# Patient Record
Sex: Female | Born: 1960 | Race: White | Hispanic: No | Marital: Married | Smoking: Never smoker
Health system: Southern US, Community
[De-identification: ages and names within clinical notes are randomized; demographics above are authoritative.]

## PROBLEM LIST (undated history)

## (undated) DIAGNOSIS — I10 Essential (primary) hypertension: Secondary | ICD-10-CM

## (undated) DIAGNOSIS — G43909 Migraine, unspecified, not intractable, without status migrainosus: Secondary | ICD-10-CM

## (undated) DIAGNOSIS — J349 Unspecified disorder of nose and nasal sinuses: Secondary | ICD-10-CM

## (undated) DIAGNOSIS — R51 Headache: Secondary | ICD-10-CM

## (undated) DIAGNOSIS — M1990 Unspecified osteoarthritis, unspecified site: Secondary | ICD-10-CM

## (undated) DIAGNOSIS — E039 Hypothyroidism, unspecified: Secondary | ICD-10-CM

## (undated) DIAGNOSIS — J302 Other seasonal allergic rhinitis: Secondary | ICD-10-CM

## (undated) DIAGNOSIS — M069 Rheumatoid arthritis, unspecified: Secondary | ICD-10-CM

## (undated) DIAGNOSIS — N189 Chronic kidney disease, unspecified: Secondary | ICD-10-CM

## (undated) DIAGNOSIS — Z87442 Personal history of urinary calculi: Secondary | ICD-10-CM

## (undated) DIAGNOSIS — K219 Gastro-esophageal reflux disease without esophagitis: Secondary | ICD-10-CM

## (undated) DIAGNOSIS — R011 Cardiac murmur, unspecified: Secondary | ICD-10-CM

## (undated) DIAGNOSIS — E079 Disorder of thyroid, unspecified: Secondary | ICD-10-CM

## (undated) HISTORY — DX: Disorder of thyroid, unspecified: E07.9

## (undated) HISTORY — PX: COLONOSCOPY: SHX174

## (undated) HISTORY — PX: LITHOTRIPSY: SUR834

## (undated) HISTORY — PX: RHINOPLASTY: SUR1284

## (undated) HISTORY — PX: OTHER SURGICAL HISTORY: SHX169

## (undated) HISTORY — PX: NASAL TURBINATE REDUCTION: SHX2072

## (undated) HISTORY — PX: SINUS EXPLORATION: SHX5214

---

## 2000-07-30 ENCOUNTER — Encounter: Payer: Self-pay | Admitting: Orthopedic Surgery

## 2000-07-30 ENCOUNTER — Ambulatory Visit (HOSPITAL_COMMUNITY): Admission: RE | Admit: 2000-07-30 | Discharge: 2000-07-30 | Payer: Self-pay | Admitting: Orthopedic Surgery

## 2000-10-08 ENCOUNTER — Encounter: Admission: RE | Admit: 2000-10-08 | Discharge: 2000-10-08 | Payer: Self-pay | Admitting: Gynecology

## 2000-10-08 ENCOUNTER — Encounter: Payer: Self-pay | Admitting: Gynecology

## 2001-08-16 ENCOUNTER — Other Ambulatory Visit: Admission: RE | Admit: 2001-08-16 | Discharge: 2001-08-16 | Payer: Self-pay | Admitting: Gynecology

## 2002-07-20 ENCOUNTER — Other Ambulatory Visit: Admission: RE | Admit: 2002-07-20 | Discharge: 2002-07-20 | Payer: Self-pay | Admitting: Gynecology

## 2005-11-03 ENCOUNTER — Other Ambulatory Visit: Admission: RE | Admit: 2005-11-03 | Discharge: 2005-11-03 | Payer: Self-pay | Admitting: Gynecology

## 2006-07-15 ENCOUNTER — Encounter: Admission: RE | Admit: 2006-07-15 | Discharge: 2006-07-15 | Payer: Self-pay | Admitting: Gynecology

## 2006-11-09 ENCOUNTER — Other Ambulatory Visit: Admission: RE | Admit: 2006-11-09 | Discharge: 2006-11-09 | Payer: Self-pay | Admitting: Gynecology

## 2008-06-29 ENCOUNTER — Emergency Department (HOSPITAL_BASED_OUTPATIENT_CLINIC_OR_DEPARTMENT_OTHER): Admission: EM | Admit: 2008-06-29 | Discharge: 2008-06-29 | Payer: Self-pay | Admitting: Emergency Medicine

## 2009-06-11 ENCOUNTER — Encounter: Admission: RE | Admit: 2009-06-11 | Discharge: 2009-06-11 | Payer: Self-pay | Admitting: Gynecology

## 2010-07-15 ENCOUNTER — Encounter: Admission: RE | Admit: 2010-07-15 | Discharge: 2010-07-15 | Payer: Self-pay | Admitting: Gynecology

## 2011-08-29 LAB — URINALYSIS, ROUTINE W REFLEX MICROSCOPIC
Nitrite: NEGATIVE
Specific Gravity, Urine: 1.015
Urobilinogen, UA: 0.2
pH: 6

## 2011-08-29 LAB — URINE MICROSCOPIC-ADD ON

## 2011-08-29 LAB — PREGNANCY, URINE: Preg Test, Ur: NEGATIVE

## 2011-08-29 LAB — URINE CULTURE

## 2012-01-21 ENCOUNTER — Encounter (HOSPITAL_BASED_OUTPATIENT_CLINIC_OR_DEPARTMENT_OTHER): Payer: Self-pay | Admitting: *Deleted

## 2012-01-21 NOTE — Progress Notes (Signed)
Dr Kaleen Mask ekg-bmet with atenolol used for migraines

## 2012-01-22 ENCOUNTER — Other Ambulatory Visit: Payer: Self-pay | Admitting: Orthopedic Surgery

## 2012-01-23 ENCOUNTER — Encounter (HOSPITAL_BASED_OUTPATIENT_CLINIC_OR_DEPARTMENT_OTHER): Payer: Self-pay | Admitting: Certified Registered Nurse Anesthetist

## 2012-01-23 ENCOUNTER — Ambulatory Visit (HOSPITAL_BASED_OUTPATIENT_CLINIC_OR_DEPARTMENT_OTHER)
Admission: RE | Admit: 2012-01-23 | Discharge: 2012-01-23 | Disposition: A | Discharge status: Home Health | Payer: BC Managed Care – PPO | Source: Ambulatory Visit | Attending: Orthopedic Surgery | Admitting: Orthopedic Surgery

## 2012-01-23 ENCOUNTER — Encounter (HOSPITAL_BASED_OUTPATIENT_CLINIC_OR_DEPARTMENT_OTHER): Payer: Self-pay | Admitting: Anesthesiology

## 2012-01-23 ENCOUNTER — Encounter (HOSPITAL_BASED_OUTPATIENT_CLINIC_OR_DEPARTMENT_OTHER)
Admission: RE | Disposition: A | Discharge status: Home Health | Payer: Self-pay | Source: Ambulatory Visit | Attending: Orthopedic Surgery

## 2012-01-23 ENCOUNTER — Ambulatory Visit (HOSPITAL_BASED_OUTPATIENT_CLINIC_OR_DEPARTMENT_OTHER): Payer: BC Managed Care – PPO | Admitting: Certified Registered Nurse Anesthetist

## 2012-01-23 DIAGNOSIS — R51 Headache: Secondary | ICD-10-CM | POA: Insufficient documentation

## 2012-01-23 DIAGNOSIS — W19XXXA Unspecified fall, initial encounter: Secondary | ICD-10-CM | POA: Insufficient documentation

## 2012-01-23 DIAGNOSIS — F430 Acute stress reaction: Secondary | ICD-10-CM | POA: Insufficient documentation

## 2012-01-23 DIAGNOSIS — IMO0002 Reserved for concepts with insufficient information to code with codable children: Secondary | ICD-10-CM | POA: Insufficient documentation

## 2012-01-23 HISTORY — DX: Gastro-esophageal reflux disease without esophagitis: K21.9

## 2012-01-23 HISTORY — PX: ORIF FINGER FRACTURE: SHX2122

## 2012-01-23 HISTORY — DX: Unspecified disorder of nose and nasal sinuses: J34.9

## 2012-01-23 HISTORY — DX: Chronic kidney disease, unspecified: N18.9

## 2012-01-23 HISTORY — DX: Headache: R51

## 2012-01-23 HISTORY — DX: Other seasonal allergic rhinitis: J30.2

## 2012-01-23 LAB — POCT HEMOGLOBIN-HEMACUE: Hemoglobin: 13.2 g/dL (ref 12.0–15.0)

## 2012-01-23 SURGERY — OPEN REDUCTION INTERNAL FIXATION (ORIF) METACARPAL (FINGER) FRACTURE
Anesthesia: General | Site: Finger | Laterality: Left | Wound class: Clean

## 2012-01-23 MED ORDER — LACTATED RINGERS IV SOLN
INTRAVENOUS | Status: DC
  Administered 2012-01-23 (×2): via INTRAVENOUS

## 2012-01-23 MED ORDER — CHLORHEXIDINE GLUCONATE 4 % EX LIQD: 60.0000 mL | Freq: Once | CUTANEOUS | Status: DC

## 2012-01-23 MED ORDER — MIDAZOLAM HCL 5 MG/5ML IJ SOLN
INTRAMUSCULAR | Status: DC | PRN
  Administered 2012-01-23: 2 mg via INTRAVENOUS

## 2012-01-23 MED ORDER — ONDANSETRON HCL 4 MG/2ML IJ SOLN
INTRAMUSCULAR | Status: DC | PRN
  Administered 2012-01-23: 4 mg via INTRAVENOUS

## 2012-01-23 MED ORDER — CEPHALEXIN 500 MG PO CAPS: 500.0000 mg | ORAL_CAPSULE | Freq: Three times a day (TID) | ORAL | Status: AC

## 2012-01-23 MED ORDER — FENTANYL CITRATE 0.05 MG/ML IJ SOLN: 50.0000 ug | INTRAMUSCULAR | Status: DC | PRN

## 2012-01-23 MED ORDER — LORAZEPAM 2 MG/ML IJ SOLN: 1.0000 mg | Freq: Once | INTRAMUSCULAR | Status: DC | PRN

## 2012-01-23 MED ORDER — LIDOCAINE HCL (CARDIAC) 20 MG/ML IV SOLN
INTRAVENOUS | Status: DC | PRN
  Administered 2012-01-23: 50 mg via INTRAVENOUS

## 2012-01-23 MED ORDER — MIDAZOLAM HCL 2 MG/2ML IJ SOLN: 1.0000 mg | INTRAMUSCULAR | Status: DC | PRN

## 2012-01-23 MED ORDER — PROPOFOL 10 MG/ML IV EMUL
INTRAVENOUS | Status: DC | PRN
  Administered 2012-01-23: 160 mg via INTRAVENOUS

## 2012-01-23 MED ORDER — FENTANYL CITRATE 0.05 MG/ML IJ SOLN
INTRAMUSCULAR | Status: DC | PRN
  Administered 2012-01-23 (×3): 25 ug via INTRAVENOUS
  Administered 2012-01-23: 100 ug via INTRAVENOUS
  Administered 2012-01-23: 25 ug via INTRAVENOUS

## 2012-01-23 MED ORDER — OXYCODONE-ACETAMINOPHEN 5-325 MG PO TABS: ORAL_TABLET | ORAL | Status: DC

## 2012-01-23 MED ORDER — LIDOCAINE HCL 2 % IJ SOLN
INTRAMUSCULAR | Status: DC | PRN
  Administered 2012-01-23: 5 mL

## 2012-01-23 MED ORDER — CEFAZOLIN SODIUM 1-5 GM-% IV SOLN: 1.0000 g | Freq: Once | INTRAVENOUS | Status: DC

## 2012-01-23 MED ORDER — HYDROMORPHONE HCL PF 1 MG/ML IJ SOLN: 0.2500 mg | INTRAMUSCULAR | Status: DC | PRN

## 2012-01-23 MED ORDER — DEXAMETHASONE SODIUM PHOSPHATE 10 MG/ML IJ SOLN
INTRAMUSCULAR | Status: DC | PRN
  Administered 2012-01-23: 10 mg via INTRAVENOUS

## 2012-01-23 MED ORDER — PROMETHAZINE HCL 25 MG/ML IJ SOLN: 6.2500 mg | INTRAMUSCULAR | Status: DC | PRN

## 2012-01-23 SURGICAL SUPPLY — 75 items
BANDAGE ADHESIVE 1X3 (GAUZE/BANDAGES/DRESSINGS) IMPLANT
BANDAGE ELASTIC 3 VELCRO ST LF (GAUZE/BANDAGES/DRESSINGS) ×2 IMPLANT
BANDAGE GAUZE ELAST BULKY 4 IN (GAUZE/BANDAGES/DRESSINGS) IMPLANT
BIT DRILL 1.0 (BIT) ×2
BIT DRILL 1.0X50 (BIT) IMPLANT
BIT DRILL SOLID 1.0X50 (BIT) ×1 IMPLANT
BIT DRILL SOLID 1.1X60 (BIT) IMPLANT
BLADE MINI RND TIP GREEN BEAV (BLADE) IMPLANT
BLADE SURG 15 STRL LF DISP TIS (BLADE) ×1 IMPLANT
BLADE SURG 15 STRL SS (BLADE) ×2
BNDG CMPR 9X4 STRL LF SNTH (GAUZE/BANDAGES/DRESSINGS)
BNDG CMPR MD 5X2 ELC HKLP STRL (GAUZE/BANDAGES/DRESSINGS) ×1
BNDG COHESIVE 1X5 TAN STRL LF (GAUZE/BANDAGES/DRESSINGS) IMPLANT
BNDG ELASTIC 2 VLCR STRL LF (GAUZE/BANDAGES/DRESSINGS) ×1 IMPLANT
BNDG ESMARK 4X9 LF (GAUZE/BANDAGES/DRESSINGS) IMPLANT
BRUSH SCRUB EZ PLAIN DRY (MISCELLANEOUS) ×2 IMPLANT
CANISTER SUCTION 1200CC (MISCELLANEOUS) ×1 IMPLANT
CLOTH BEACON ORANGE TIMEOUT ST (SAFETY) ×2 IMPLANT
CORDS BIPOLAR (ELECTRODE) ×2 IMPLANT
COVER MAYO STAND STRL (DRAPES) ×2 IMPLANT
COVER TABLE BACK 60X90 (DRAPES) ×2 IMPLANT
CUFF TOURNIQUET SINGLE 18IN (TOURNIQUET CUFF) ×1 IMPLANT
DECANTER SPIKE VIAL GLASS SM (MISCELLANEOUS) ×1 IMPLANT
DRAPE OEC MINIVIEW 54X84 (DRAPES) ×2 IMPLANT
DRAPE SURG 17X23 STRL (DRAPES) ×2 IMPLANT
DRAPE UTILITY W/TAPE 26X15 (DRAPES) ×1 IMPLANT
GLOVE BIO SURGEON STRL SZ 6.5 (GLOVE) ×1 IMPLANT
GLOVE BIOGEL M STRL SZ7.5 (GLOVE) ×2 IMPLANT
GLOVE BIOGEL PI IND STRL 7.0 (GLOVE) IMPLANT
GLOVE BIOGEL PI INDICATOR 7.0 (GLOVE) ×2
GLOVE ECLIPSE 6.5 STRL STRAW (GLOVE) ×1 IMPLANT
GLOVE EXAM NITRILE PF MED BLUE (GLOVE) ×1 IMPLANT
GLOVE ORTHO TXT STRL SZ7.5 (GLOVE) ×2 IMPLANT
GOWN BRE IMP PREV XXLGXLNG (GOWN DISPOSABLE) ×2 IMPLANT
GOWN PREVENTION PLUS XLARGE (GOWN DISPOSABLE) ×2 IMPLANT
NEEDLE 27GAX1X1/2 (NEEDLE) ×1 IMPLANT
NS IRRIG 1000ML POUR BTL (IV SOLUTION) ×2 IMPLANT
PACK BASIN DAY SURGERY FS (CUSTOM PROCEDURE TRAY) ×2 IMPLANT
PAD CAST 3X4 CTTN HI CHSV (CAST SUPPLIES) ×1 IMPLANT
PAD CAST 4YDX4 CTTN HI CHSV (CAST SUPPLIES) IMPLANT
PADDING CAST ABS 4INX4YD NS (CAST SUPPLIES) ×1
PADDING CAST ABS COTTON 4X4 ST (CAST SUPPLIES) ×1 IMPLANT
PADDING CAST COTTON 3X4 STRL (CAST SUPPLIES) ×2
PADDING CAST COTTON 4X4 STRL (CAST SUPPLIES)
PADDING UNDERCAST 2  STERILE (CAST SUPPLIES) ×1 IMPLANT
PLATE T 1.3 3H HEAD/8H SFT (Plate) IMPLANT
PLATE-T 1.3 3H HEAD/8H SFT (Plate) ×2 IMPLANT
SCREW SELF TAP CORTEX 1.3 10MM (Screw) ×1 IMPLANT
SCREW SELF TAP CORTEX 1.3 11MM (Screw) ×3 IMPLANT
SCREW SELF TAP CORTEX 1.3 12MM (Screw) ×1 IMPLANT
SCREW SELF TAP CORTEX 1.3 6MM (Screw) ×1 IMPLANT
SCREW SELF TAP CORTEX 1.3 7MM (Screw) ×1 IMPLANT
SCREW SELF TAP CORTEX 1.3 8MM (Screw) ×2 IMPLANT
SCREW SELF TAP CORTEX 1.3 9MM (Screw) ×1 IMPLANT
SLEEVE SCD COMPRESS KNEE MED (MISCELLANEOUS) ×1 IMPLANT
SPLINT PLASTER CAST XFAST 3X15 (CAST SUPPLIES) ×1 IMPLANT
SPLINT PLASTER XTRA FASTSET 3X (CAST SUPPLIES) ×14
SPONGE GAUZE 4X4 12PLY (GAUZE/BANDAGES/DRESSINGS) ×1 IMPLANT
STOCKINETTE 4X48 STRL (DRAPES) ×2 IMPLANT
STRIP CLOSURE SKIN 1/2X4 (GAUZE/BANDAGES/DRESSINGS) ×1 IMPLANT
SUCTION FRAZIER TIP 10 FR DISP (SUCTIONS) IMPLANT
SUT ETHILON 4 0 PS 2 18 (SUTURE) ×1 IMPLANT
SUT MERSILENE 4 0 P 3 (SUTURE) ×2 IMPLANT
SUT PROLENE 3 0 PS 2 (SUTURE) ×1 IMPLANT
SUT PROLENE 4 0 P 3 18 (SUTURE) ×2 IMPLANT
SUT VIC AB 4-0 P-3 18XBRD (SUTURE) ×1 IMPLANT
SUT VIC AB 4-0 P3 18 (SUTURE) ×2
SYR 3ML 23GX1 SAFETY (SYRINGE) IMPLANT
SYR BULB 3OZ (MISCELLANEOUS) ×2 IMPLANT
SYR CONTROL 10ML LL (SYRINGE) IMPLANT
TOWEL OR 17X24 6PK STRL BLUE (TOWEL DISPOSABLE) ×2 IMPLANT
TRAY DSU PREP LF (CUSTOM PROCEDURE TRAY) ×2 IMPLANT
TUBE CONNECTING 20X1/4 (TUBING) ×1 IMPLANT
UNDERPAD 30X30 INCONTINENT (UNDERPADS AND DIAPERS) ×2 IMPLANT
WATER STERILE IRR 1000ML POUR (IV SOLUTION) IMPLANT

## 2012-01-23 NOTE — Op Note (Signed)
OP NOTE DICTATED:  01/23/12 [id number]

## 2012-01-23 NOTE — H&P (Signed)
  Valerie Pacheco is an 51 y.o. female.   Chief Complaint: Fracture/injury to left small finger HPI: .   While in [place] she accidentally lost her balance while walking on an uneven sidewalk landing hard onto her outstretched left hand. She developed a comminuted pilon type fracture at the base of her left small finger proximal phalanx.  She was [facility] at [facility] where her comminuted articular fracture was identified.  She was very appropriately splinted by Dr. Charlett Blake.  She seeks an upper extremity orthopaedic consult.    Past Medical History  Diagnosis Date  . Seasonal allergies   . Sinus disorder   . Headache   . GERD (gastroesophageal reflux disease)   . Chronic kidney disease     multiple kidney stones    Past Surgical History  Procedure Date  . Nasal turbinate reduction   . Rhinoplasty   . Sinus exploration   . Bladder repair w/ cesarean section   . Lithotripsy     x2    History reviewed. No pertinent family history. Social History:  reports that she has never smoked. She does not have any smokeless tobacco history on file. She reports that she drinks alcohol. She reports that she does not use illicit drugs.  Allergies: No Known Allergies  No current facility-administered medications on file as of .   No current outpatient prescriptions on file as of .    No results found for this or any previous visit (from the past 48 hour(s)).  No results found.   Pertinent items are noted in HPI.  Height 5\' 6"  (1.676 m), weight 79.379 kg (175 lb).  General appearance: alert Head: Normocephalic, without obvious abnormality Neck: supple, symmetrical, trachea midline Resp: clear to auscultation bilaterally Cardio: regular rate and rhythm, S1, S2 normal, no murmur, click, rub or gallop GI: normal findings: bowel sounds normal Extremities: Examination of her left hand reveals a  comminuted pilon type fracture at the base of her left  small finger proximal phalanx. Neurovascular she is intact Pulses: 2+ and symmetric Skin: Skin color, texture, turgor normal. No rashes or lesions or normal Neurologic: Grossly normal    Assessment/Plan Impression: Comminuted fracture at the base of her left small finger proximal phalanx  Plan: Patient to be taken to the operating room to undergo open reduction internal fixation of fracture base left small finger proximal phalanx. The procedure risks benefits and postoperative course were discussed with the patient length she was in agreement with this plan.  DASNOIT,Nathanyal Ashmead J 01/23/2012, 7:48 AM   H&P documentation: 01/23/2012  -History and Physical Reviewed  -Patient has been re-examined  -No change in the plan of care  Wyn Forster, MD

## 2012-01-23 NOTE — Anesthesia Postprocedure Evaluation (Signed)
  Anesthesia Post-op Note  Patient: [name]  Procedure(s) Performed: Procedure(s) (LRB): OPEN REDUCTION INTERNAL FIXATION (ORIF) METACARPAL (FINGER) FRACTURE (Left)  Patient Location: PACU  Anesthesia Type: General  Level of Consciousness: awake  Airway and Oxygen Therapy: Patient Spontanous Breathing  Post-op Pain: mild  Post-op Assessment: Post-op Vital signs reviewed, Patient's Cardiovascular Status Stable, Respiratory Function Stable, Patent Airway, No signs of Nausea or vomiting and Pain level controlled  Post-op Vital Signs: stable  Complications: No apparent anesthesia complications

## 2012-01-23 NOTE — Transfer of Care (Signed)
Immediate Anesthesia Transfer of Care Note  Patient: Valerie Pacheco [facility]  Procedure(s) Performed: Procedure(s) (LRB): OPEN REDUCTION INTERNAL FIXATION (ORIF) METACARPAL (FINGER) FRACTURE (Left)  Patient Location: PACU  Anesthesia Type: General  Level of Consciousness: sedated  Airway & Oxygen Therapy: Patient Spontanous Breathing and Patient connected to face mask oxygen  Post-op Assessment: Report given to PACU RN and Post -op Vital signs reviewed and stable  Post vital signs: Reviewed and stable  Complications: No apparent anesthesia complications

## 2012-01-23 NOTE — Brief Op Note (Signed)
01/23/2012  12:06 PM  PATIENT:  Valerie Pacheco  51 y.o. female  PRE-OPERATIVE DIAGNOSIS:  fracture left small finger proximal phalanx intra articular at metacarpal phalangeal  joint  POST-OPERATIVE DIAGNOSIS:  fracture left small finger proximal phalanx pilon type wit 4mm loss of length and joint surface irregularity  PROCEDURE:  Procedure(s) (LRB): OPEN REDUCTION INTERNAL FIXATION BRIDGE PLATE LENGTHENING OSTEOPLASTY AND JOINT SURFACE RECONSTRUCTION OF LEFT SMALL PROXIMAL PHALANX FRACTURE   SURGEON:  Wyn Forster., MD   PHYSICIAN ASSISTANT:   ASSISTANTS: Mallory Shirk.A-C   ANESTHESIA:   general  EBL:  Total I/O In: 200 [I.V.:200] Out: -   BLOOD ADMINISTERED:none  DRAINS: none   LOCAL MEDICATIONS USED:  LIDOCAINE   SPECIMEN:  No Specimen  DISPOSITION OF SPECIMEN:  N/A  COUNTS:  YES  TOURNIQUET:  * Missing tourniquet times found for documented tourniquets in log:  25605 *2  DICTATION: .Other Dictation: Dictation Number [id number]  PLAN OF CARE: Discharge to home after PACU  PATIENT DISPOSITION:  PACU - hemodynamically stable.

## 2012-01-23 NOTE — Anesthesia Preprocedure Evaluation (Signed)
Anesthesia Evaluation  Patient identified by MRN, date of birth, ID band Patient awake    Reviewed: Allergy & Precautions, H&P , NPO status , Patient's Chart, lab work & pertinent test results  Airway Mallampati: I TM Distance: >3 FB Neck ROM: Full    Dental   Pulmonary    Pulmonary exam normal       Cardiovascular     Neuro/Psych  Headaches,    GI/Hepatic GERD-  ,  Endo/Other    Renal/GU      Musculoskeletal   Abdominal   Peds  Hematology   Anesthesia Other Findings   Reproductive/Obstetrics                           Anesthesia Physical Anesthesia Plan  ASA: II  Anesthesia Plan: General   Post-op Pain Management:    Induction: Intravenous  Airway Management Planned: LMA  Additional Equipment:   Intra-op Plan:   Post-operative Plan: Extubation in OR  Informed Consent: I have reviewed the patients History and Physical, chart, labs and discussed the procedure including the risks, benefits and alternatives for the proposed anesthesia with the patient or authorized representative who has indicated his/her understanding and acceptance.     Plan Discussed with: CRNA and Surgeon  Anesthesia Plan Comments:         Anesthesia Quick Evaluation

## 2012-01-23 NOTE — Anesthesia Procedure Notes (Addendum)
Procedure Name: LMA Insertion Date/Time: 01/23/2012 10:49 AM Performed by: Meyer Russel Pre-anesthesia Checklist: Patient identified, Emergency Drugs available, Suction available, Patient being monitored and Timeout performed Patient Re-evaluated:Patient Re-evaluated prior to inductionOxygen Delivery Method: Circle System Utilized Preoxygenation: Pre-oxygenation with 100% oxygen Intubation Type: IV induction Ventilation: Mask ventilation without difficulty LMA: LMA inserted LMA Size: 4.0 Number of attempts: 1 Airway Equipment and Method: bite block Placement Confirmation: positive ETCO2 and breath sounds checked- equal and bilateral Tube secured with: Tape Dental Injury: Teeth and Oropharynx as per pre-operative assessment

## 2012-01-26 ENCOUNTER — Encounter (HOSPITAL_BASED_OUTPATIENT_CLINIC_OR_DEPARTMENT_OTHER): Payer: Self-pay | Admitting: Orthopedic Surgery

## 2012-01-26 NOTE — Op Note (Signed)
NAME:  Darrough, Tiena                  ACCOUNT NO.:  MEDICAL RECORD NO.:  [id number]  LOCATION:                                 FACILITY:  PHYSICIAN:  Katy Fitch. Monterrio Gerst, M.D.      DATE OF BIRTH:  DATE OF PROCEDURE:  01/23/2012 DATE OF DISCHARGE:                              OPERATIVE REPORT   PREOPERATIVE DIAGNOSIS:  Profoundly comminuted shortened malrotated and articular fracture of left small finger proximal phalanx, metaphyseal base and epiphysis.  POSTOPERATIVE DIAGNOSIS:  Profoundly comminuted shortened malrotated and articular fracture of left small finger proximal phalanx, metaphyseal base and epiphysis with 4 mm of shortening and complex comminution of articular surface.  OPERATION:  Lengthening osteoplasty and articular surface correction of left small finger proximal phalanx, pilon-type fracture with application of a 1.3 mm ASIF stainless steel plate system.  OPERATING SURGEON:  Katy Fitch. Harley Fitzwater, M.D.  ASSISTANT:  Marveen Reeks Dasnoit, PA-C  ANESTHESIA:  General by LMA.  SUPERVISING ANESTHESIOLOGIST:  Bedelia Person, M.D.  INDICATIONS:  Valerie Pacheco is a 51 year old woman who was visiting [place], when she fell on an uneven sidewalk sustaining a complex injury to her left small finger.  She was seen by Dr. Lunette Stands at the [facility] where her complex fracture predicament was noted.  Dr. Charlett Blake placed an ulnar gutter splint and advised to seek a Hand Surgery consult.  On clinical examination, she was noted to have ecchymosis and swelling. She had an angular deformity and shortening with abduction of her small finger.  We had a detailed informed consent preoperatively with Nedra Hai and her husband explaining how complex this fracture was.  Closed treatment always fails and stiffness ensues.  Kirschner wire fixation routinely fails.  We recommended bridge plate fixation with an anatomic reduction as possible, so as to enable early active  range of motion exercises.  After informed consent, she was brought to the operating at this time.  She understands there is a small chance and will need to recover the plate at a later date.  DESCRIPTION OF PROCEDURE:  Naturi Alarid was brought to room #1 of the [facility] and placed in supine position upon the operating table.  Following an Anesthesia consult by Dr. Gypsy Balsam, general anesthesia by LMA technique was recommended and accepted.  In room #1 under Dr. Burnett Corrente direct supervision, general anesthesia was induced by LMA technique followed by administration 1 g of Ancef as IV prophylactic antibiotic.  The left arm and hand were then prepped with Betadine soap and solution, sterilely draped.  A pneumatic tourniquet applied proximal left brachium.  Following exsanguination of left arm with Esmarch bandage, the arterial tourniquet was inflated to 220 mmHg.  Procedure commenced with routine surgical time-out.  The proximal phalanx was exposed in an extensile dorsal incision incorporating the metacarpophalangeal joint capsule.  The extensor tendon was split in the midline.  The periosteum elevated, the joint capsule was entered to review the articular surface.  This was a really complicated fracture with 4 mm of shortening, smashing of the metaphysis, and splitting of the articular surface at the metacarpophalangeal joint.  We meticulously teased the fracture part, removed  clot and debris, followed by irrigation.  The fracture fragments were assembled and an ASIF 1.3 mm plate was contoured to fit the phalanx.  This was then placed with fracture clamps followed by gentle placement of metaphyseal and diaphyseal screws.  The split of the joint was corrected with a buttress plate technique on the ulnar and palmar surface of the proximal phalanx.  We ultimately lagged the number of screws into the articular fragments for stability.  Multiple C-arm images documented a very  satisfactory reduction.  Overall, the alignment of the finger was recovered and the abduction position corrected.  This is not a simple fracture, this was a lengthening and joint arthroplasty.  This will lead to some stiffness of the metacarpophalangeal joint.  We will need to begin immediate range of motion exercises and work vigorously in therapy to try to prevent significant postoperative stiffness.  The wound was thoroughly irrigated followed by meticulous repair of the periosteum with a running suture of 4-0 Vicryl followed by repair of the extensor with interrupted figure-of-eight sutures of 4-0 Mersilene.  The skin was repaired with intradermal 3-0 Prolene and Steri-Strips.  Ms. Facchini was placed in a forearm-based safe position splint.  There were no apparent complications.  For aftercare, she was provided prescriptions for Percocet 5/325 one to two tablets p.o. q.4-6 hours p.r.n. pain.  She is encouraged to use Aleve or Advil as a nonnarcotic medication and she is provided Keflex 500 mg 1 p.o. q.8 hours x4 days as a prophylactic antibiotic.     Katy Fitch Chaston Bradburn, M.D.     RVS/MEDQ  D:  01/23/2012  T:  01/23/2012  Job:  [id number]  cc:   Lunette Stands, M.D.

## 2012-08-09 ENCOUNTER — Other Ambulatory Visit: Payer: Self-pay | Admitting: Gynecology

## 2012-08-09 DIAGNOSIS — Z1231 Encounter for screening mammogram for malignant neoplasm of breast: Secondary | ICD-10-CM

## 2012-08-24 ENCOUNTER — Ambulatory Visit
Admission: RE | Admit: 2012-08-24 | Discharge: 2012-08-24 | Disposition: A | Discharge status: Home / Self Care | Payer: BC Managed Care – PPO | Source: Ambulatory Visit | Attending: Gynecology | Admitting: Gynecology

## 2012-08-24 DIAGNOSIS — Z1231 Encounter for screening mammogram for malignant neoplasm of breast: Secondary | ICD-10-CM

## 2013-10-18 ENCOUNTER — Other Ambulatory Visit: Payer: Self-pay

## 2013-10-18 DIAGNOSIS — Z1231 Encounter for screening mammogram for malignant neoplasm of breast: Secondary | ICD-10-CM

## 2013-11-18 ENCOUNTER — Ambulatory Visit
Admission: RE | Admit: 2013-11-18 | Discharge: 2013-11-18 | Disposition: A | Discharge status: Home / Self Care | Payer: BC Managed Care – PPO | Source: Ambulatory Visit

## 2013-11-18 DIAGNOSIS — Z1231 Encounter for screening mammogram for malignant neoplasm of breast: Secondary | ICD-10-CM

## 2015-01-12 ENCOUNTER — Other Ambulatory Visit: Payer: Self-pay

## 2015-01-12 DIAGNOSIS — Z1231 Encounter for screening mammogram for malignant neoplasm of breast: Secondary | ICD-10-CM

## 2015-01-19 ENCOUNTER — Ambulatory Visit
Admission: RE | Admit: 2015-01-19 | Discharge: 2015-01-19 | Disposition: A | Discharge status: Home / Self Care | Payer: BLUE CROSS/BLUE SHIELD | Source: Ambulatory Visit

## 2015-01-19 DIAGNOSIS — Z1231 Encounter for screening mammogram for malignant neoplasm of breast: Secondary | ICD-10-CM

## 2016-02-19 ENCOUNTER — Other Ambulatory Visit: Payer: Self-pay

## 2016-02-19 DIAGNOSIS — Z1231 Encounter for screening mammogram for malignant neoplasm of breast: Secondary | ICD-10-CM

## 2016-03-03 ENCOUNTER — Ambulatory Visit
Admission: RE | Admit: 2016-03-03 | Discharge: 2016-03-03 | Disposition: A | Discharge status: Home / Self Care | Payer: BLUE CROSS/BLUE SHIELD | Source: Ambulatory Visit

## 2016-03-03 DIAGNOSIS — Z1231 Encounter for screening mammogram for malignant neoplasm of breast: Secondary | ICD-10-CM

## 2017-02-11 DIAGNOSIS — M0589 Other rheumatoid arthritis with rheumatoid factor of multiple sites: Secondary | ICD-10-CM | POA: Diagnosis not present

## 2017-02-11 DIAGNOSIS — Z79899 Other long term (current) drug therapy: Secondary | ICD-10-CM | POA: Diagnosis not present

## 2017-02-11 DIAGNOSIS — M2550 Pain in unspecified joint: Secondary | ICD-10-CM | POA: Diagnosis not present

## 2017-02-12 ENCOUNTER — Encounter: Payer: Self-pay | Admitting: Neurology

## 2017-02-12 ENCOUNTER — Ambulatory Visit (INDEPENDENT_AMBULATORY_CARE_PROVIDER_SITE_OTHER): Payer: BLUE CROSS/BLUE SHIELD | Admitting: Neurology

## 2017-02-12 VITALS — BP 119/79 | HR 80 | Resp 16 | Ht 65.0 in | Wt 188.5 lb

## 2017-02-12 DIAGNOSIS — R5383 Other fatigue: Secondary | ICD-10-CM

## 2017-02-12 DIAGNOSIS — J301 Allergic rhinitis due to pollen: Secondary | ICD-10-CM | POA: Diagnosis not present

## 2017-02-12 DIAGNOSIS — R0683 Snoring: Secondary | ICD-10-CM

## 2017-02-12 DIAGNOSIS — G4719 Other hypersomnia: Secondary | ICD-10-CM | POA: Diagnosis not present

## 2017-02-12 DIAGNOSIS — G479 Sleep disorder, unspecified: Secondary | ICD-10-CM | POA: Insufficient documentation

## 2017-02-12 DIAGNOSIS — G4710 Hypersomnia, unspecified: Secondary | ICD-10-CM | POA: Diagnosis not present

## 2017-02-12 DIAGNOSIS — G4730 Sleep apnea, unspecified: Secondary | ICD-10-CM | POA: Diagnosis not present

## 2017-02-12 NOTE — Progress Notes (Signed)
SLEEP MEDICINE CLINIC   Provider:  Larey Seat, M D  Referring Provider: No ref. provider found Primary Care Physician:  Melinda Crutch, MD  Chief Complaint  Patient presents with  . Sleep disturbance    rm 11, New Pt, Sleep consult    HPI:  Valerie Pacheco is a 56 y.o. female , seen here as a referral from Dr. Harrington Challenger.   Her family has begun to complain that she snores crazy apparently very loud.  The patient is unaware of her snoring, she does not choke herself awake, snore herself awake. She has struggled with extensive daytime sleepiness, and endorsed today the Epworth score at 16 points, fatigue severity score at 36 points. There was a need to take a nap over lunch time, being so sleepy in daytime.   I had the pleasure of seeing Valerie Pacheco today, a former patient of Dr. Claudie Fisherman. She had been [facility] for migraine treatments and Dr. Sima Matas had initiated a sleep study at our [facility] sleep lab. At the time the body mass index was 26.5 the neck circumference was measured at 14 inches. The study took place on 12/10/2010. The study did not document any presence of sleep apnea or other forms of sleep disordered breathing, in frequent leg movements were noted and spontaneous arousals, which also was without a physiological factor linked to them.. The patient's sleep complaints or concerns has however continued and she recently contacted her primary care office with a request to be reevaluated.  Sleep habits are as follows: Patient's intended bedtime is around 11 PM, but usually she watches TV for an hour or 2 before. She watches TV in the bedroom as well. She sets her TV set on a timer. Once the TV is switched off the bedroom would be cool quiet and dark. She shares a bedroom with her husband, he uses a CPAP machine. Her daughter whose bedroom is all the way down the hall, hears her snoring at night. Her family sometimes has woken her  stating that she snored and she felt she wasn't even asleep. Usually she has only one bathroom break, she sleeps on the bed flat with only one pillow for head support. She prefers to sleep supine or on her side rarely on her belly. Her alarm clock rings at 7:20 AM, she snoozes it multiple times before she rises. She always feels that she could do with another hour of sleep is not fully refreshed or restored and wakes up with very dry mouth, congested and tired. Headaches are sometimes present.   Sleep medical history and family sleep history: allergic rhinitis, on chronic Flonase and zyrtec, Thyroid disease, vitamin D deficiency, GERD, she still followed by Dr. Domingo Cocking for migraines, rheumatoid arthritis is followed by Dr. Gavin Pound. No sleep aids are used.  Father has OSA.   Social history:  Married, one daughter in college. She quit smoking after college, has not used tobacco or more than 3 decades. She drinks alcohol on the weekends usually wine. She drinks a glass of iced tea every other day, usually at dinner. She prefers drinking water she does avoid coffee or sodas.  Review of Systems: Out of a complete 14 system review, the patient complains of only the following symptoms, and all other reviewed systems are negative.  Snoring, loud. Daughter is not sure about apnea.    How likely are you to doze in the following situations: 0 = not  likely, 1 = slight chance, 2 = moderate chance, 3 = high chance  Sitting and Reading? 3 Watching Television?1 Sitting inactive in a public place (theater or meeting)?3 As a passenger in a car for an hour without a break?2   Lying down in the afternoon when circumstances permit?3 Sitting and talking to someone?1 Sitting quietly after lunch without alcohol?3 In a car, while stopped for a few minutes in traffic?0  Total = 16    , Fatigue severity score 36  , depression score n/a -    Social History   Social History  . Marital status: Married      Spouse name: N/A  . Number of children: 2  . Years of education: 15   Occupational History  .      office    Social History Main Topics  . Smoking status: Never Smoker  . Smokeless tobacco: Never Used  . Alcohol use Yes     Comment: occ  . Drug use: No  . Sexual activity: Not on file   Other Topics Concern  . Not on file   Social History Narrative   Lives with husband   Caffeine- ice tea every other day    Family History  Problem Relation Age of Onset  . Diabetes Father   . Hypertension Father   . Heart disease Father     Past Medical History:  Diagnosis Date  . Chronic kidney disease    multiple kidney stones  . GERD (gastroesophageal reflux disease)   . Headache(784.0)   . 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 Seasonal allergies   . Sinus disorder   . Thyroid condition     Past Surgical History:  Procedure Laterality Date  . BLADDER REPAIR W/ CESAREAN SECTION    . LITHOTRIPSY     x2  . NASAL TURBINATE REDUCTION    . ORIF FINGER FRACTURE  01/23/2012   Procedure: OPEN REDUCTION INTERNAL FIXATION (ORIF) METACARPAL (FINGER) FRACTURE;  Surgeon: Cammie Sickle., MD;  Location: [place];  Service: Orthopedics;  Laterality: Left;  left small proximal interphalangeal fracture  . RHINOPLASTY    . SINUS EXPLORATION      Current Outpatient Prescriptions  Medication Sig Dispense Refill  . Adalimumab (HUMIRA PEN-CROHNS STARTER) 40 MG/0.8ML PNKT Inject into the skin. 02/12/17 40 mg every other Friday    . cetirizine (ZYRTEC) 10 MG tablet Take 10 mg by mouth daily.    . cholecalciferol (VITAMIN D) 1000 UNITS tablet Take 1,000 Units by mouth daily. Takes 2    . estradiol (VIVELLE-DOT) 0.1 MG/24HR patch Place 1 patch onto the skin 2 (two) times a week.    . folic acid (FOLVITE) 1 MG tablet 2 mg daily.  2  . levothyroxine (SYNTHROID, LEVOTHROID) 100 MCG tablet 100 mcg daily.  1  . Magnesium 250 MG TABS Take by mouth.    . methotrexate (RHEUMATREX) 2.5 MG  tablet 2.5 mg. Taking 8 tabs every Tuesday  0  . omeprazole (PRILOSEC) 20 MG capsule Take 20 mg by mouth daily.    . progesterone (PROMETRIUM) 200 MG capsule 200 mg.  6  . pyridOXINE (VITAMIN B-6) 100 MG tablet Take 100 mg by mouth daily.    . verapamil (VERELAN PM) 120 MG 24 hr capsule 120 mg daily.  1   No current facility-administered medications for this visit.     Allergies as of 02/12/2017  . (No Known Allergies)    Vitals: BP 119/79   Pulse  80   Resp 16   Ht 5\' 5"  (1.651 m)   Wt 188 lb 8 oz (85.5 kg)   BMI 31.37 kg/m  Last Weight:  Wt Readings from Last 1 Encounters:  02/12/17 188 lb 8 oz (85.5 kg)   ZOX:WRUE mass index is 31.37 kg/m.     Last Height:   Ht Readings from Last 1 Encounters:  02/12/17 5\' 5"  (1.651 m)    Physical exam:  General: The patient is awake, alert and appears not in acute distress. The patient is well groomed. Head: Normocephalic, atraumatic. Neck is supple. Mallampati 5- puffy uvula, lateral restriction.  neck circumference: 15.75. Nasal airflow congested , there is a deviated septum-  no TMJ click  evident . Retrognathia is seen.  Cardiovascular:  Regular rate and rhythm , without  murmurs or carotid bruit, and without distended neck veins. Respiratory: Lungs are clear to auscultation. Skin:  Without evidence of edema, or rash Trunk: BMI is elevated -  The patient's posture is stooped.   Neurologic exam : The patient is awake and alert, oriented to place and time.    Attention span & concentration ability appears normal.  Speech is fluent,  without dysarthria, dysphonia or aphasia.  Mood and affect are appropriate.  Cranial nerves: Pupils are equal and briskly reactive to light.  Funduscopic exam without evidence of pallor or edema.  Extraocular movements  in vertical and horizontal planes intact and without nystagmus. Visual fields by finger perimetry are intact. Hearing to finger rub intact. Facial sensation intact to fine  touch.Facial motor strength is symmetric and tongue and uvula move midline. Shoulder shrug was symmetrical.   Motor exam:   Normal tone, muscle bulk and symmetric strength in all extremities.  Sensory:  Fine touch, pinprick and vibration, Proprioception tested in the upper extremities was normal.  Coordination:  Finger-to-nose maneuver  normal without evidence of ataxia, dysmetria or tremor.  Gait and station: Patient walks without assistive device and is able unassisted to climb up to the exam table. Strength within normal limits.  Stance is stable and normal.  Deep tendon reflexes: in the  upper and lower extremities are symmetric and intact. Babinski maneuver response is downgoing.  Assessment:  After physical and neurologic examination, review of laboratory studies,  Personal review of imaging studies, reports of other /same  Imaging studies ,  Results of polysomnography/ neurophysiology testing and pre-existing records as far as provided in visit., my assessment is   1) the patient reports an excessive degree of daytime sleepiness even after she felt that she had improved her Epworth sleepiness score is endorsed at 16. She will fall asleep when not physically active or mentally stimulated. She has apparently fallen asleep many times without being aware of being a sleep and it is her family waking her but made her realize that she must have been sleeping. These micro-sleep attacks the high Epworth score and the witnessed snoring and apnea speak for the presence of obstructive sleep apnea. I will order a sleep test and visited with the patient after the results are back. She will also receive sleep hygiene information, and weight loss information.  HST , per Agricultural consultant.    Dear Dr. Harrington Challenger, Thank you for allowing me to participate in the sleep care of your patient , Mrs. Telicia, Hod[place]kiss I strongly suspect to have obstructive sleep apnea based on her body mass index, neck  circumference, Mallampati and her very congested and obstructed nasal passage way. She  is a mouth breather by habits and has been since childhood. She does have excessive soft tissue obstructing the upper airway the soft palate is not well tone and appears swollen. She has additional disorders that contribute to excessive daytime sleepiness aside from apnea or snoring and these are hypothyroidism and GERD.  The patient was advised of the nature of the diagnosed sleep disorder , the treatment options and risks for general a health and wellness arising from not treating the condition.  I spent more than 40 minutes of face to face time with the patient. Greater than 50% of time was spent in counseling and coordination of care. We have discussed the diagnosis and differential and I answered the patient's questions.      Asencion Partridge Song Garris MD  02/12/2017

## 2017-02-24 DIAGNOSIS — G43019 Migraine without aura, intractable, without status migrainosus: Secondary | ICD-10-CM | POA: Diagnosis not present

## 2017-02-24 DIAGNOSIS — G43719 Chronic migraine without aura, intractable, without status migrainosus: Secondary | ICD-10-CM | POA: Diagnosis not present

## 2017-03-04 ENCOUNTER — Institutional Professional Consult (permissible substitution): Payer: BLUE CROSS/BLUE SHIELD | Admitting: Neurology

## 2017-03-09 ENCOUNTER — Ambulatory Visit (INDEPENDENT_AMBULATORY_CARE_PROVIDER_SITE_OTHER): Payer: BLUE CROSS/BLUE SHIELD | Admitting: Neurology

## 2017-03-09 DIAGNOSIS — J301 Allergic rhinitis due to pollen: Secondary | ICD-10-CM

## 2017-03-09 DIAGNOSIS — G4730 Sleep apnea, unspecified: Secondary | ICD-10-CM

## 2017-03-09 DIAGNOSIS — R5383 Other fatigue: Secondary | ICD-10-CM

## 2017-03-09 DIAGNOSIS — R0683 Snoring: Secondary | ICD-10-CM

## 2017-03-09 DIAGNOSIS — Z8709 Personal history of other diseases of the respiratory system: Secondary | ICD-10-CM | POA: Diagnosis not present

## 2017-03-09 DIAGNOSIS — J302 Other seasonal allergic rhinitis: Secondary | ICD-10-CM | POA: Diagnosis not present

## 2017-03-09 DIAGNOSIS — G4734 Idiopathic sleep related nonobstructive alveolar hypoventilation: Secondary | ICD-10-CM

## 2017-03-09 DIAGNOSIS — Z7289 Other problems related to lifestyle: Secondary | ICD-10-CM | POA: Diagnosis not present

## 2017-03-09 DIAGNOSIS — G4719 Other hypersomnia: Secondary | ICD-10-CM

## 2017-03-09 DIAGNOSIS — G4710 Hypersomnia, unspecified: Secondary | ICD-10-CM

## 2017-03-09 DIAGNOSIS — G479 Sleep disorder, unspecified: Secondary | ICD-10-CM

## 2017-03-10 NOTE — Procedures (Signed)
[facility] @[facility] [address] [place], [place] [address] NAME: Latyra Jaye                             DOB: Mar 24, 1961 MEDICAL RECORD [id number]                 DOS: 03/09/17 REFERRING PHYSICIAN: Juanda Crumble Ross,MD          Study performed: HST/Out of Center Sleep test HISTORY:  BECCI BATTY is a 38 yr.old  female patient of  Dr. Harrington Challenger'. Her family has begun to complain that she snores very loud. The patient is unaware of her snoring, she does not choke herself awake, nor snores herself awake. She has struggled with extensive daytime sleepiness, and endorsed today the Epworth score at 16 points, fatigue severity score at 36 points. She takes a nap over lunch time.                                                                                                                                                             Dr. Letitia Caul had initiated a sleep study at [facility] sleep at [facility] on 12/10/2010. At the time the body mass index was 26.5 the neck circumference was measured at 14 inches. The study did not document any presence of sleep apnea or other forms of sleep disordered breathing, infrequent leg movements were noted and spontaneous arousals, without a physiological factor being linked to these.       STUDY RESULTS: Total Recording Time: 7hrs. 15m. Total Apnea/Hypopnea Index (AHI) was 1.7 /hr. The RDI (snoring) was 6.1/hr.   Average Oxygen Saturation: SpO2 at 87% /Lowest Oxygen Saturation: SpO2 nadir at 84%, Duration of nocturnal hypoxemia was 311 minutes at or below 88% SpO2. Average Heart Rate: 70 bpm, regular sinus rhythm  IMPRESSION:  There is snoring but not associated sleep apnea.  This patient has protracted nocturnal hypoxemia. 311 minutes over a total recording time of 7 hours and 16 minutes.  RECOMMENDATION: Referral to pulmonology for prolonged nocturnal hypoxemia without apnea.  This low degree of apnea does not allow treatment by CPAP, and a  dental device cannot be used in hypoxemia.  I certify that I have reviewed the raw data recording prior to the issuance of this report in accordance with the standards of Accreditation of the American Academy of Sleep medicine (AASM) Larey Seat, MD  03-10-2017  Diplomat, American Board of Psychiatry and Neurology/ Diplomat, Tax adviser of Sleep Medicine Market researcher of [employer] at [employer]

## 2017-03-10 NOTE — Addendum Note (Signed)
Addended by: Larey Seat on: 03/10/2017 12:44 PM   Modules accepted: Orders

## 2017-03-11 DIAGNOSIS — M0589 Other rheumatoid arthritis with rheumatoid factor of multiple sites: Secondary | ICD-10-CM | POA: Diagnosis not present

## 2017-03-12 ENCOUNTER — Telehealth: Payer: Self-pay

## 2017-03-12 NOTE — Telephone Encounter (Signed)
-----   Message from Larey Seat, MD sent at 03/10/2017 12:44 PM EDT ----- IMPRESSION:  There is snoring but not associated sleep apnea.  This patient has protracted nocturnal hypoxemia. 311 minutes over a total recording time of 7 hours and 16 minutes.  RECOMMENDATION: Referral to pulmonology for prolonged nocturnal hypoxemia without apnea.  This low degree of apnea does not allow treatment by CPAP, and a dental device cannot be used in hypoxemia.

## 2017-03-12 NOTE — Telephone Encounter (Signed)
LM for patient to call back for results

## 2017-03-12 NOTE — Telephone Encounter (Signed)
Patient called back and I was able to give results and recommendations. She is aware and ok with referral placed for Pulmonology.

## 2017-04-08 DIAGNOSIS — M0589 Other rheumatoid arthritis with rheumatoid factor of multiple sites: Secondary | ICD-10-CM | POA: Diagnosis not present

## 2017-04-28 ENCOUNTER — Telehealth: Payer: Self-pay | Admitting: Neurology

## 2017-04-28 DIAGNOSIS — J3489 Other specified disorders of nose and nasal sinuses: Secondary | ICD-10-CM | POA: Diagnosis not present

## 2017-04-28 DIAGNOSIS — R0683 Snoring: Secondary | ICD-10-CM | POA: Diagnosis not present

## 2017-04-28 DIAGNOSIS — J320 Chronic maxillary sinusitis: Secondary | ICD-10-CM | POA: Diagnosis not present

## 2017-04-28 DIAGNOSIS — J343 Hypertrophy of nasal turbinates: Secondary | ICD-10-CM | POA: Diagnosis not present

## 2017-04-28 NOTE — Telephone Encounter (Signed)
Department was for [facility] Sleep and referral straight sleep Workque . I Changed department and Pulmo logy Referral has been sent.

## 2017-04-28 NOTE — Telephone Encounter (Signed)
Pt is wanting to know where she was referred for pulmonologist. She has not rec'd a call to schedule and appt. Please call

## 2017-05-01 ENCOUNTER — Other Ambulatory Visit: Payer: Self-pay | Admitting: Otolaryngology

## 2017-05-07 ENCOUNTER — Other Ambulatory Visit: Payer: Self-pay | Admitting: Otolaryngology

## 2017-05-11 ENCOUNTER — Other Ambulatory Visit: Payer: Self-pay | Admitting: Gynecology

## 2017-05-11 ENCOUNTER — Encounter: Payer: Self-pay | Admitting: Pulmonary Disease

## 2017-05-11 DIAGNOSIS — Z1231 Encounter for screening mammogram for malignant neoplasm of breast: Secondary | ICD-10-CM

## 2017-05-11 NOTE — Pre-Procedure Instructions (Signed)
Valerie Pacheco [facility]  05/11/2017      [facility] - [place], [place] - [address] AT [place] [address] [place] [address] Phone: [phone] Fax: [phone]    Your procedure is scheduled on June 22  Report to [place] at [place].M.  Call this number if you have problems the morning of surgery:  [phone]   Remember:  Do not eat food or drink liquids after midnight.   Take these medicines the morning of surgery with A SIP OF WATER levothyroxine (SYNTHROID, LEVOTHROID),    7 days prior to surgery STOP taking any Aspirin, Aleve, Naproxen, Ibuprofen, Motrin, Advil, Goody's, BC's, all herbal medications, fish oil, and all vitamins    Do not wear jewelry, make-up or nail polish.  Do not wear lotions, powders, or perfumes, or deoderant.  Do not shave 48 hours prior to surgery.    Do not bring valuables to the hospital.  [facility] is not responsible for any belongings or valuables.  Contacts, dentures or bridgework may not be worn into surgery.  Leave your suitcase in the car.  After surgery it may be brought to your room.  For patients admitted to the hospital, discharge time will be determined by your treatment team.  Patients discharged the day of surgery will not be allowed to drive home.    Special instructions:   [facility]- Preparing For Surgery  Before surgery, you can play an important role. Because skin is not sterile, your skin needs to be as free of germs as possible. You can reduce the number of germs on your skin by washing with CHG (chlorahexidine gluconate) Soap before surgery.  CHG is an antiseptic cleaner which kills germs and bonds with the skin to continue killing germs even after washing.  Please do not use if you have an allergy to CHG or antibacterial soaps. If your skin becomes reddened/irritated stop using the CHG.  Do not shave (including legs and underarms) for at least  48 hours prior to first CHG shower. It is OK to shave your face.  Please follow these instructions carefully.   1. Shower the NIGHT BEFORE SURGERY and the MORNING OF SURGERY with CHG.   2. If you chose to wash your hair, wash your hair first as usual with your normal shampoo.  3. After you shampoo, rinse your hair and body thoroughly to remove the shampoo.  4. Use CHG as you would any other liquid soap. You can apply CHG directly to the skin and wash gently with a scrungie or a clean washcloth.   5. Apply the CHG Soap to your body ONLY FROM THE NECK DOWN.  Do not use on open wounds or open sores. Avoid contact with your eyes, ears, mouth and genitals (private parts). Wash genitals (private parts) with your normal soap.  6. Wash thoroughly, paying special attention to the area where your surgery will be performed.  7. Thoroughly rinse your body with warm water from the neck down.  8. DO NOT shower/wash with your normal soap after using and rinsing off the CHG Soap.  9. Pat yourself dry with a CLEAN TOWEL.   10. Wear CLEAN PAJAMAS   11. Place CLEAN SHEETS on your bed the night of your first shower and DO NOT SLEEP WITH PETS.    Day of Surgery: Do not apply any deodorants/lotions. Please wear clean clothes to the  hospital/surgery center.      Please read over the following fact sheets that you were given.

## 2017-05-12 ENCOUNTER — Encounter (HOSPITAL_COMMUNITY): Payer: Self-pay | Admitting: *Deleted

## 2017-05-12 ENCOUNTER — Encounter (HOSPITAL_COMMUNITY)
Admission: RE | Admit: 2017-05-12 | Discharge: 2017-05-12 | Disposition: A | Discharge status: Home / Self Care | Payer: BLUE CROSS/BLUE SHIELD | Source: Ambulatory Visit | Attending: Otolaryngology | Admitting: Otolaryngology

## 2017-05-12 DIAGNOSIS — Z01810 Encounter for preprocedural cardiovascular examination: Secondary | ICD-10-CM | POA: Diagnosis not present

## 2017-05-12 DIAGNOSIS — Z01812 Encounter for preprocedural laboratory examination: Secondary | ICD-10-CM | POA: Insufficient documentation

## 2017-05-12 HISTORY — DX: Hypothyroidism, unspecified: E03.9

## 2017-05-12 HISTORY — DX: Personal history of urinary calculi: Z87.442

## 2017-05-12 HISTORY — DX: Cardiac murmur, unspecified: R01.1

## 2017-05-12 HISTORY — DX: Rheumatoid arthritis, unspecified: M06.9

## 2017-05-12 HISTORY — DX: Unspecified osteoarthritis, unspecified site: M19.90

## 2017-05-12 HISTORY — DX: Essential (primary) hypertension: I10

## 2017-05-12 LAB — BASIC METABOLIC PANEL
ANION GAP: 6 (ref 5–15)
BUN: 15 mg/dL (ref 6–20)
CHLORIDE: 107 mmol/L (ref 101–111)
CO2: 23 mmol/L (ref 22–32)
Calcium: 8.8 mg/dL — ABNORMAL LOW (ref 8.9–10.3)
Creatinine, Ser: 0.8 mg/dL (ref 0.44–1.00)
GFR calc non Af Amer: >60 mL/min (ref >60)
Glucose, Bld: 163 mg/dL — ABNORMAL HIGH (ref 65–99)
Potassium: 3.7 mmol/L (ref 3.5–5.1)
Sodium: 136 mmol/L (ref 135–145)

## 2017-05-12 LAB — CBC
HCT: 38.2 % (ref 36.0–46.0)
HEMOGLOBIN: 12.9 g/dL (ref 12.0–15.0)
MCH: 30.6 pg (ref 26.0–34.0)
MCHC: 33.8 g/dL (ref 30.0–36.0)
MCV: 90.5 fL (ref 78.0–100.0)
Platelets: 211 10*3/uL (ref 150–400)
RBC: 4.22 MIL/uL (ref 3.87–5.11)
RDW: 13.3 % (ref 11.5–15.5)
WBC: 5.5 10*3/uL (ref 4.0–10.5)

## 2017-05-12 NOTE — Progress Notes (Addendum)
PCP is Dr. Lawerance Cruel-  States she saw a cardiologist years ago in the 1990's, doesn't remember the Dr's name, but it was at [place] she had 2 sleep studies done and both showed no sleep apnea.  Denies any cough, fever, or chest pain. Denies ever having a card cath, but doesn't remember if she had any other tests.

## 2017-05-12 NOTE — Pre-Procedure Instructions (Signed)
Lavanda Nevels [facility]  05/12/2017      [facility] - [place],  - [address] AT [place] [address] [place] [address] Phone: [phone] Fax: [phone]    Your procedure is scheduled on June 22.  Report to [facility] Admitting at 530 A.M.  Call this number if you have problems the morning of surgery:  [phone]   Remember:  Do not eat food or drink liquids after midnight.  Take these medicines the morning of surgery with A SIP OF WATER  Levothyroxine (Synthroid)  Stop taking aspirin, BC's, Goody's, Herbal medications, Fish Oil, Vitamins, Ibuprofen, Advil, Motrin, Naproxen, Aleve   Do not wear jewelry, make-up or nail polish.  Do not wear lotions, powders, or perfumes, or deoderant.  Do not shave 48 hours prior to surgery.  Men may shave face and neck.  Do not bring valuables to the hospital.  [facility] is not responsible for any belongings or valuables.  Contacts, dentures or bridgework may not be worn into surgery.  Leave your suitcase in the car.  After surgery it may be brought to your room.  For patients admitted to the hospital, discharge time will be determined by your treatment team.  Patients discharged the day of surgery will not be allowed to drive home.    Special instructions:  [facility] - Preparing for Surgery  Before surgery, you can play an important role.  Because skin is not sterile, your skin needs to be as free of germs as possible.  You can reduce the number of germs on you skin by washing with CHG (chlorahexidine gluconate) soap before surgery.  CHG is an antiseptic cleaner which kills germs and bonds with the skin to continue killing germs even after washing.  Please DO NOT use if you have an allergy to CHG or antibacterial soaps.  If your skin becomes reddened/irritated stop using the CHG and inform your nurse when you arrive at Short Stay.  Do not shave (including legs and  underarms) for at least 48 hours prior to the first CHG shower.  You may shave your face.  Please follow these instructions carefully:   1.  Shower with CHG Soap the night before surgery and the                                morning of Surgery.  2.  If you choose to wash your hair, wash your hair first as usual with your       normal shampoo.  3.  After you shampoo, rinse your hair and body thoroughly to remove the                      Shampoo.  4.  Use CHG as you would any other liquid soap.  You can apply chg directly       to the skin and wash gently with scrungie or a clean washcloth.  5.  Apply the CHG Soap to your body ONLY FROM THE NECK DOWN.        Do not use on open wounds or open sores.  Avoid contact with your eyes,       ears, mouth and genitals (private parts).  Wash genitals (private parts)       with your normal soap.  6.  Wash thoroughly, paying  special attention to the area where your surgery        will be performed.  7.  Thoroughly rinse your body with warm water from the neck down.  8.  DO NOT shower/wash with your normal soap after using and rinsing off       the CHG Soap.  9.  Pat yourself dry with a clean towel.            10.  Wear clean pajamas.            11.  Place clean sheets on your bed the night of your first shower and do not        sleep with pets.  Day of Surgery  Do not apply any lotions/deoderants the morning of surgery.  Please wear clean clothes to the hospital/surgery center.     Please read over the following fact sheets that you were given. Pain Booklet, Coughing and Deep Breathing and Surgical Site Infection Prevention

## 2017-05-14 DIAGNOSIS — M2550 Pain in unspecified joint: Secondary | ICD-10-CM | POA: Diagnosis not present

## 2017-05-14 DIAGNOSIS — Z79899 Other long term (current) drug therapy: Secondary | ICD-10-CM | POA: Diagnosis not present

## 2017-05-14 DIAGNOSIS — M0589 Other rheumatoid arthritis with rheumatoid factor of multiple sites: Secondary | ICD-10-CM | POA: Diagnosis not present

## 2017-05-15 ENCOUNTER — Encounter (HOSPITAL_COMMUNITY): Payer: Self-pay

## 2017-05-21 MED ORDER — DEXAMETHASONE SODIUM PHOSPHATE 10 MG/ML IJ SOLN
10.0000 mg | Freq: Once | INTRAMUSCULAR | Status: AC
  Administered 2017-05-22: 10 mg via INTRAVENOUS
  Filled 2017-05-21: qty 1

## 2017-05-21 MED ORDER — CEFAZOLIN SODIUM-DEXTROSE 2-4 GM/100ML-% IV SOLN
2.0000 g | INTRAVENOUS | Status: AC
  Administered 2017-05-22: 2 g via INTRAVENOUS
  Filled 2017-05-21: qty 100

## 2017-05-22 ENCOUNTER — Ambulatory Visit (HOSPITAL_COMMUNITY): Payer: BLUE CROSS/BLUE SHIELD | Admitting: Emergency Medicine

## 2017-05-22 ENCOUNTER — Encounter (HOSPITAL_COMMUNITY): Payer: Self-pay | Admitting: *Deleted

## 2017-05-22 ENCOUNTER — Encounter (HOSPITAL_COMMUNITY)
Admission: RE | Disposition: A | Discharge status: Home / Self Care | Payer: Self-pay | Source: Ambulatory Visit | Attending: Otolaryngology

## 2017-05-22 ENCOUNTER — Ambulatory Visit (HOSPITAL_COMMUNITY): Payer: BLUE CROSS/BLUE SHIELD | Admitting: Certified Registered"

## 2017-05-22 ENCOUNTER — Ambulatory Visit (HOSPITAL_COMMUNITY)
Admission: RE | Admit: 2017-05-22 | Discharge: 2017-05-22 | Disposition: A | Discharge status: Home / Self Care | Payer: BLUE CROSS/BLUE SHIELD | Source: Ambulatory Visit | Attending: Otolaryngology | Admitting: Otolaryngology

## 2017-05-22 DIAGNOSIS — J301 Allergic rhinitis due to pollen: Secondary | ICD-10-CM | POA: Diagnosis not present

## 2017-05-22 DIAGNOSIS — Z87442 Personal history of urinary calculi: Secondary | ICD-10-CM | POA: Insufficient documentation

## 2017-05-22 DIAGNOSIS — Z79890 Hormone replacement therapy (postmenopausal): Secondary | ICD-10-CM | POA: Diagnosis not present

## 2017-05-22 DIAGNOSIS — E039 Hypothyroidism, unspecified: Secondary | ICD-10-CM | POA: Diagnosis not present

## 2017-05-22 DIAGNOSIS — G4710 Hypersomnia, unspecified: Secondary | ICD-10-CM | POA: Diagnosis not present

## 2017-05-22 DIAGNOSIS — Z79899 Other long term (current) drug therapy: Secondary | ICD-10-CM | POA: Insufficient documentation

## 2017-05-22 DIAGNOSIS — M069 Rheumatoid arthritis, unspecified: Secondary | ICD-10-CM | POA: Diagnosis not present

## 2017-05-22 DIAGNOSIS — K219 Gastro-esophageal reflux disease without esophagitis: Secondary | ICD-10-CM | POA: Insufficient documentation

## 2017-05-22 DIAGNOSIS — Z6831 Body mass index (BMI) 31.0-31.9, adult: Secondary | ICD-10-CM | POA: Insufficient documentation

## 2017-05-22 DIAGNOSIS — R0683 Snoring: Secondary | ICD-10-CM | POA: Diagnosis not present

## 2017-05-22 DIAGNOSIS — J343 Hypertrophy of nasal turbinates: Secondary | ICD-10-CM | POA: Insufficient documentation

## 2017-05-22 DIAGNOSIS — J449 Chronic obstructive pulmonary disease, unspecified: Secondary | ICD-10-CM | POA: Insufficient documentation

## 2017-05-22 DIAGNOSIS — J3489 Other specified disorders of nose and nasal sinuses: Secondary | ICD-10-CM | POA: Diagnosis not present

## 2017-05-22 HISTORY — PX: SINUS ENDO WITH FUSION: SHX5329

## 2017-05-22 HISTORY — PX: TURBINATE REDUCTION: SHX6157

## 2017-05-22 SURGERY — SURGERY, PARANASAL SINUS, ENDOSCOPIC, WITH NASAL SEPTOPLASTY, TURBINOPLASTY, AND MAXILLARY SINUSOTOMY
Anesthesia: General | Site: Nose | Laterality: Bilateral

## 2017-05-22 MED ORDER — LIDOCAINE-EPINEPHRINE 1 %-1:100000 IJ SOLN
INTRAMUSCULAR | Status: DC | PRN
  Administered 2017-05-22: 20 mL

## 2017-05-22 MED ORDER — SODIUM CHLORIDE 0.9 % IR SOLN
Status: DC | PRN
  Administered 2017-05-22: 1000 mL

## 2017-05-22 MED ORDER — SUCCINYLCHOLINE CHLORIDE 200 MG/10ML IV SOSY
PREFILLED_SYRINGE | INTRAVENOUS | Status: AC
  Filled 2017-05-22: qty 10

## 2017-05-22 MED ORDER — ARTIFICIAL TEARS OPHTHALMIC OINT
TOPICAL_OINTMENT | OPHTHALMIC | Status: DC | PRN
  Administered 2017-05-22: 1 via OPHTHALMIC

## 2017-05-22 MED ORDER — OXYCODONE HCL 5 MG/5ML PO SOLN: 5.0000 mg | Freq: Once | ORAL | Status: DC | PRN

## 2017-05-22 MED ORDER — ROCURONIUM BROMIDE 10 MG/ML (PF) SYRINGE
PREFILLED_SYRINGE | INTRAVENOUS | Status: AC
  Filled 2017-05-22: qty 5

## 2017-05-22 MED ORDER — SUGAMMADEX SODIUM 200 MG/2ML IV SOLN
INTRAVENOUS | Status: DC | PRN
  Administered 2017-05-22: 175 mg via INTRAVENOUS

## 2017-05-22 MED ORDER — 0.9 % SODIUM CHLORIDE (POUR BTL) OPTIME
TOPICAL | Status: DC | PRN
  Administered 2017-05-22: 1000 mL

## 2017-05-22 MED ORDER — MIDAZOLAM HCL 2 MG/2ML IJ SOLN
INTRAMUSCULAR | Status: AC
  Filled 2017-05-22: qty 2

## 2017-05-22 MED ORDER — CHLORHEXIDINE GLUCONATE CLOTH 2 % EX PADS: 6.0000 | MEDICATED_PAD | Freq: Once | CUTANEOUS | Status: DC

## 2017-05-22 MED ORDER — PROPOFOL 10 MG/ML IV BOLUS
INTRAVENOUS | Status: DC | PRN
  Administered 2017-05-22: 120 mg via INTRAVENOUS
  Administered 2017-05-22: 20 mg via INTRAVENOUS
  Administered 2017-05-22 (×2): 30 mg via INTRAVENOUS

## 2017-05-22 MED ORDER — MIDAZOLAM HCL 5 MG/5ML IJ SOLN
INTRAMUSCULAR | Status: DC | PRN
  Administered 2017-05-22: 2 mg via INTRAVENOUS

## 2017-05-22 MED ORDER — ARTIFICIAL TEARS OPHTHALMIC OINT
TOPICAL_OINTMENT | OPHTHALMIC | Status: AC
  Filled 2017-05-22: qty 3.5

## 2017-05-22 MED ORDER — MUPIROCIN 2 % EX OINT
TOPICAL_OINTMENT | CUTANEOUS | Status: AC
  Filled 2017-05-22: qty 22

## 2017-05-22 MED ORDER — LIDOCAINE 2% (20 MG/ML) 5 ML SYRINGE
INTRAMUSCULAR | Status: AC
  Filled 2017-05-22: qty 5

## 2017-05-22 MED ORDER — SALINE SPRAY 0.65 % NA SOLN
1.0000 | NASAL | Status: DC | PRN
  Filled 2017-05-22: qty 44

## 2017-05-22 MED ORDER — DEXAMETHASONE SODIUM PHOSPHATE 10 MG/ML IJ SOLN: 10.0000 mg | Freq: Once | INTRAMUSCULAR | Status: DC

## 2017-05-22 MED ORDER — FENTANYL CITRATE (PF) 100 MCG/2ML IJ SOLN
INTRAMUSCULAR | Status: DC | PRN
  Administered 2017-05-22: 50 ug via INTRAVENOUS
  Administered 2017-05-22: 100 ug via INTRAVENOUS

## 2017-05-22 MED ORDER — HYDROCODONE-ACETAMINOPHEN 5-325 MG PO TABS: 1.0000 | ORAL_TABLET | Freq: Four times a day (QID) | ORAL | 0 refills | Status: DC | PRN

## 2017-05-22 MED ORDER — OXYMETAZOLINE HCL 0.05 % NA SOLN
NASAL | Status: DC | PRN
  Administered 2017-05-22: 1 via TOPICAL

## 2017-05-22 MED ORDER — OXYCODONE HCL 5 MG PO TABS: 5.0000 mg | ORAL_TABLET | Freq: Once | ORAL | Status: DC | PRN

## 2017-05-22 MED ORDER — ROCURONIUM BROMIDE 100 MG/10ML IV SOLN
INTRAVENOUS | Status: DC | PRN
  Administered 2017-05-22: 60 mg via INTRAVENOUS

## 2017-05-22 MED ORDER — ONDANSETRON HCL 4 MG/2ML IJ SOLN
INTRAMUSCULAR | Status: DC | PRN
  Administered 2017-05-22: 4 mg via INTRAVENOUS

## 2017-05-22 MED ORDER — TRIAMCINOLONE ACETONIDE 40 MG/ML IJ SUSP
INTRAMUSCULAR | Status: AC
Start: 2017-05-22 — End: 2017-05-22
  Filled 2017-05-22: qty 5

## 2017-05-22 MED ORDER — LACTATED RINGERS IV SOLN
INTRAVENOUS | Status: DC | PRN
  Administered 2017-05-22: 07:00:00 via INTRAVENOUS

## 2017-05-22 MED ORDER — ONDANSETRON HCL 4 MG/2ML IJ SOLN
INTRAMUSCULAR | Status: AC
  Filled 2017-05-22: qty 2

## 2017-05-22 MED ORDER — FENTANYL CITRATE (PF) 250 MCG/5ML IJ SOLN
INTRAMUSCULAR | Status: AC
  Filled 2017-05-22: qty 5

## 2017-05-22 MED ORDER — SUGAMMADEX SODIUM 200 MG/2ML IV SOLN
INTRAVENOUS | Status: AC
  Filled 2017-05-22: qty 2

## 2017-05-22 MED ORDER — PROPOFOL 10 MG/ML IV BOLUS
INTRAVENOUS | Status: AC
Start: 2017-05-22 — End: 2017-05-22
  Filled 2017-05-22: qty 40

## 2017-05-22 MED ORDER — OXYMETAZOLINE HCL 0.05 % NA SOLN
NASAL | Status: AC
  Filled 2017-05-22: qty 15

## 2017-05-22 MED ORDER — FENTANYL CITRATE (PF) 100 MCG/2ML IJ SOLN: 25.0000 ug | INTRAMUSCULAR | Status: DC | PRN

## 2017-05-22 MED ORDER — LIDOCAINE-EPINEPHRINE 1 %-1:100000 IJ SOLN
INTRAMUSCULAR | Status: AC
  Filled 2017-05-22: qty 1

## 2017-05-22 SURGICAL SUPPLY — 55 items
ATTRACTOMAT 16X20 MAGNETIC DRP (DRAPES) IMPLANT
BLADE ROTATE RAD 40 4 M4 (BLADE) IMPLANT
BLADE ROTATE RAD 40 4MM M4 (BLADE)
BLADE ROTATE TRICUT 4MX13CM M4 (BLADE) ×1
BLADE ROTATE TRICUT 4X13 M4 (BLADE) ×1 IMPLANT
BLADE SURG 15 STRL LF DISP TIS (BLADE) IMPLANT
BLADE SURG 15 STRL SS (BLADE)
CANISTER SUCT 3000ML PPV (MISCELLANEOUS) ×6 IMPLANT
COAGULATOR SUCT 6 FR SWTCH (ELECTROSURGICAL)
COAGULATOR SUCT 8FR VV (MISCELLANEOUS) ×2 IMPLANT
COAGULATOR SUCT SWTCH 10FR 6 (ELECTROSURGICAL) IMPLANT
DRAPE HALF SHEET 40X57 (DRAPES) IMPLANT
DRESSING NASAL KENNEDY 3.5X.9 (MISCELLANEOUS) IMPLANT
DRSG NASAL KENNEDY 3.5X.9 (MISCELLANEOUS)
ELECT COATED BLADE 2.86 ST (ELECTRODE) IMPLANT
ELECT REM PT RETURN 9FT ADLT (ELECTROSURGICAL) ×3
ELECTRODE REM PT RTRN 9FT ADLT (ELECTROSURGICAL) ×1 IMPLANT
FILTER ARTHROSCOPY CONVERTOR (FILTER) ×3 IMPLANT
FLUID NSS /IRRIG 1000 ML XXX (MISCELLANEOUS) ×3 IMPLANT
GAUZE SPONGE 2X2 8PLY STRL LF (GAUZE/BANDAGES/DRESSINGS) ×1 IMPLANT
GLOVE BIOGEL M 7.0 STRL (GLOVE) ×6 IMPLANT
GOWN STRL REUS W/ TWL LRG LVL3 (GOWN DISPOSABLE) ×2 IMPLANT
GOWN STRL REUS W/TWL LRG LVL3 (GOWN DISPOSABLE) ×9
KIT BASIN OR (CUSTOM PROCEDURE TRAY) ×3 IMPLANT
KIT ROOM TURNOVER OR (KITS) ×3 IMPLANT
NDL 18GX1X1/2 (RX/OR ONLY) (NEEDLE) IMPLANT
NDL HYPO 25GX1X1/2 BEV (NEEDLE) IMPLANT
NEEDLE 18GX1X1/2 (RX/OR ONLY) (NEEDLE) IMPLANT
NEEDLE HYPO 25GX1X1/2 BEV (NEEDLE) ×3 IMPLANT
NS IRRIG 1000ML POUR BTL (IV SOLUTION) ×3 IMPLANT
PAD ARMBOARD 7.5X6 YLW CONV (MISCELLANEOUS) ×6 IMPLANT
PAD ENT ADHESIVE 25PK (MISCELLANEOUS) ×3 IMPLANT
PENCIL BUTTON HOLSTER BLD 10FT (ELECTRODE) IMPLANT
SPECIMEN JAR SMALL (MISCELLANEOUS) ×3 IMPLANT
SPLINT NASAL DOYLE BI-VL (GAUZE/BANDAGES/DRESSINGS) IMPLANT
SPONGE GAUZE 2X2 STER 10/PKG (GAUZE/BANDAGES/DRESSINGS) ×2
SPONGE NEURO XRAY DETECT 1X3 (DISPOSABLE) ×3 IMPLANT
SUT ETHILON 3 0 FSL (SUTURE) IMPLANT
SUT ETHILON 3 0 PS 1 (SUTURE) IMPLANT
SUT PLAIN 4 0 ~~LOC~~ 1 (SUTURE) IMPLANT
SWAB COLLECTION DEVICE MRSA (MISCELLANEOUS) IMPLANT
SWAB CULTURE ESWAB REG 1ML (MISCELLANEOUS) IMPLANT
SYR CONTROL 10ML LL (SYRINGE) ×3 IMPLANT
TOWEL OR 17X24 6PK STRL BLUE (TOWEL DISPOSABLE) ×3 IMPLANT
TRACKER ENT INSTRUMENT (MISCELLANEOUS) ×3 IMPLANT
TRACKER ENT PATIENT (MISCELLANEOUS) ×1 IMPLANT
TRAP SPECIMEN MUCOUS 40CC (MISCELLANEOUS) ×2 IMPLANT
TRAY ENT MC OR (CUSTOM PROCEDURE TRAY) ×3 IMPLANT
TUBE CONNECTING 12'X1/4 (SUCTIONS) ×1
TUBE CONNECTING 12X1/4 (SUCTIONS) ×2 IMPLANT
TUBE SALEM SUMP 16 FR W/ARV (TUBING) ×3 IMPLANT
TUBING EXTENTION W/L.L. (IV SETS) ×3 IMPLANT
TUBING STRAIGHTSHOT EPS 5PK (TUBING) ×3 IMPLANT
WATER STERILE IRR 1000ML POUR (IV SOLUTION) ×3 IMPLANT
WIPE INSTRUMENT VISIWIPE 73X73 (MISCELLANEOUS) ×3 IMPLANT

## 2017-05-22 NOTE — Anesthesia Preprocedure Evaluation (Signed)
Anesthesia Evaluation  Patient identified by MRN, date of birth, ID band Patient awake    Reviewed: Allergy & Precautions, NPO status , Patient's Chart, lab work & pertinent test results  History of Anesthesia Complications Negative for: history of anesthetic complications  Airway Mallampati: III  TM Distance: >3 FB Neck ROM: Full    Dental  (+) Teeth Intact   Pulmonary neg pulmonary ROS,    breath sounds clear to auscultation       Cardiovascular (-) hypertension(-) angina(-) Past MI and (-) CHF  Rhythm:Regular     Neuro/Psych  Headaches, negative psych ROS   GI/Hepatic GERD  Medicated and Controlled,  Endo/Other  Hypothyroidism Morbid obesity  Renal/GU Renal disease     Musculoskeletal  (+) Arthritis ,   Abdominal   Peds  Hematology negative hematology ROS (+)   Anesthesia Other Findings   Reproductive/Obstetrics                             Anesthesia Physical Anesthesia Plan  ASA: II  Anesthesia Plan: General   Post-op Pain Management:    Induction: Intravenous  PONV Risk Score and Plan: 3 and Ondansetron, Dexamethasone, Propofol and Midazolam  Airway Management Planned: Oral ETT  Additional Equipment: None  Intra-op Plan:   Post-operative Plan: Extubation in OR  Informed Consent: I have reviewed the patients History and Physical, chart, labs and discussed the procedure including the risks, benefits and alternatives for the proposed anesthesia with the patient or authorized representative who has indicated his/her understanding and acceptance.   Dental advisory given  Plan Discussed with: CRNA and Surgeon  Anesthesia Plan Comments:         Anesthesia Quick Evaluation

## 2017-05-22 NOTE — Brief Op Note (Signed)
05/22/2017  8:42 AM  PATIENT:  Valerie Pacheco  56 y.o. female  PRE-OPERATIVE DIAGNOSIS:  nasal obstruction and nasal turbinate hypertrophy  POST-OPERATIVE DIAGNOSIS:  Same   PROCEDURE:  Procedure(s): BILATERAL ENDOSCOPIC RESECTION OF CONCHA BULLOSA WITH FUSION SCAN (Bilateral) BILATERAL INFERIOR TURBINATE REDUCTION (Bilateral)  SURGEON:  Surgeon(s) and Role:    Jerrell Belfast, MD - Primary  PHYSICIAN ASSISTANT:   ASSISTANTS: none   ANESTHESIA:   general  EBL:  Total I/O In: 500 [I.V.:500] Out: - 50cc  BLOOD ADMINISTERED:none  DRAINS: none   LOCAL MEDICATIONS USED:  LIDOCAINE  and Amount: 6 ml  SPECIMEN:  No Specimen  DISPOSITION OF SPECIMEN:  N/A  COUNTS:  YES  TOURNIQUET:  * No tourniquets in log *  DICTATION: .Other Dictation: Dictation Number [id number]  PLAN OF CARE: Discharge to home after PACU  PATIENT DISPOSITION:  PACU - hemodynamically stable.   Delay start of Pharmacological VTE agent (>24hrs) due to surgical blood loss or risk of bleeding: not applicable

## 2017-05-22 NOTE — Op Note (Signed)
NAME:  Jaffee, Kariah                  ACCOUNT NO.:  MEDICAL RECORD NO.:  [id number]  LOCATION:                                 FACILITY:  PHYSICIAN:  Tayquan Gassman L. Wilburn Cornelia, M.D.DATE OF BIRTH:  1961/07/05  DATE OF PROCEDURE:  05/22/2017 DATE OF DISCHARGE:                              OPERATIVE REPORT   LOCATION:  [facility] Main OR.  PREOPERATIVE AND POSTOPERATIVE DIAGNOSES/INDICATIONS FOR SURGERY: 1. Chronic nasal airway obstruction. 2. Bilateral middle turbinate concha bullosa. 3. Bilateral inferior turbinate hypertrophy.  SURGICAL PROCEDURE: 1. Bilateral endoscopic resection of middle turbinate concha bullosa. 2. Bilateral inferior turbinate reduction.  ANESTHESIA:  General endotracheal.  SURGEON:  Jerrell Belfast, M.D.  COMPLICATIONS:  None.  BLOOD LOSS:  Less than 50 mL.  DISPOSITION:  The patient transferred from the operating room to the recovery room in stable condition.  BRIEF HISTORY:  The patient is a 56 year old white female, who was referred to our office with a history of chronic nasal airway obstruction.  She had undergone prior sinonasal surgery and was stable from an infection standpoint, but had chronic issues with nasal airway obstruction, particularly worse at night with nasal congestion and having nighttime snoring.  Previous sleep study negative for sleep apnea.  Examination in the office and CT scan showed significant bilateral inferior turbinate hypertrophy and bilateral middle turbinate concha bullosa, which were contributing to the patient's symptoms of nasal airway obstruction.  She has failed appropriate medical therapy, and given her history and findings, I recommended the above surgical procedures.  The risks and benefits were discussed in detail and the patient and her family understood and agreed with our plan for surgery, which is scheduled on elective basis as an outpatient at [place].  DESCRIPTION OF PROCEDURE:   The patient was brought to the operating room on May 22, 2017.  She was placed in a supine position on the operating table.  General endotracheal anesthesia was established without difficulty.  When the patient was adequately anesthetized, she was positioned and prepped and draped.  Her nose was then injected with a total of 6 mL of 1% lidocaine with 1:100,000 dilution of epinephrine, which was injected in a submucosal fashion along the middle turbinate, lateral nasal wall, uncinate process, and inferior turbinates on each side.  The patient's nose was then packed with Afrin-soaked cottonoid pledgets, were left in place for approximately 10 minutes to allow for vasoconstriction and hemostasis.  The patient was then positioned, prepped and draped, and the surgical procedure was begun.  Endoscopic nasal examination was undertaken.  A Xomed navigation head gear was applied and anatomic and surgical landmarks were identified and confirmed.  The Xomed device was used for navigation throughout the resection of the middle turbinate concha bullosa.  Beginning on the patient's left-hand side, using a 0-degree endoscope and a sickle knife, the middle turbinate concha bullosa was incised in a vertical fashion. Endoscopic sinus scissors were then used to dissect the lateral component of the concha bullosa and the straight microdebrider was used to remove tissue.  This left the middle meatus widely patent and the residual middle turbinate in good position.  The patient's  right hand middle turbinate concha bullosa was treated in a similar fashion using a sickle knife to create a vertically oriented incision, carried through the mucosa and turbinate bone.  The endoscopic sinus scissors were then used to dissect the lateral component of the concha bullosa and the microdebrider was used to resect the remaining tissue creating a widely patent middle turbinate.  The patient had a small amount of bleeding  bilaterally, which was treated with monopolar suction cautery along the margin of the turbinate.  There was no active bleeding and no packing was placed.  Attention was then turned to the inferior turbinate, where bilateral inferior turbinate reduction was performed using bipolar intramural cautery set at 35 W.  Two submucosal passes were made in each inferior turbinate.  The turbinates were then outfractured.  Small vertical incisions were created, overlying soft tissue elevated, and a small amount of turbinate bone was then resected.  This created a widely patent nasal passageway bilaterally.  The patient's nasal cavity was then irrigated and suctioned.  Sponge count was correct.  There was no active bleeding.  An orogastric tube was passed.  The stomach contents were aspirated.  The patient was then awakened from anesthetic.  She was extubated and transferred from the operating room to the recovery room in stable condition.          ______________________________ Early Chars Wilburn Cornelia, M.D.     DLS/MEDQ  D:  87/57/9728  T:  05/22/2017  Job:  [id number]

## 2017-05-22 NOTE — Transfer of Care (Signed)
Immediate Anesthesia Transfer of Care Note  Patient: Valerie Pacheco [facility]  Procedure(s) Performed: Procedure(s): BILATERAL ENDOSCOPIC RESECTION OF CONCHA BULLOSA WITH FUSION SCAN (Bilateral) BILATERAL INFERIOR TURBINATE REDUCTION (Bilateral)  Patient Location: PACU  Anesthesia Type:General  Level of Consciousness: awake, alert , oriented and sedated  Airway & Oxygen Therapy: Patient Spontanous Breathing and Patient connected to face mask oxygen  Post-op Assessment: Report given to RN, Post -op Vital signs reviewed and stable and Patient moving all extremities  Post vital signs: Reviewed and stable  Last Vitals:  Vitals:   05/22/17 0603 05/22/17 0847  BP: (!) 143/73 (!) 132/91  Pulse: 69 82  Resp: 18 13  Temp: 36.8 C 36.7 C    Last Pain:  Vitals:   05/22/17 0603  TempSrc: Oral      Patients Stated Pain Goal: 2 (82/42/35 3614)  Complications: No apparent anesthesia complications

## 2017-05-22 NOTE — H&P (Signed)
Valerie Pacheco is an 56 y.o. female.   Chief Complaint: Nasal Obstruction HPI: Hx of progressive nasal airway obstruction  Past Medical History:  Diagnosis Date  . Arthritis   . Chronic kidney disease    multiple kidney stones  . GERD (gastroesophageal reflux disease)   . Headache(784.0)   . Heart murmur    was told in the 1990's, but not since  . History of kidney stones   . Hypertension    when pregnacy  . Hypothyroidism   . Rheumatoid arthritis ([place])   . Seasonal allergies   . Sinus disorder   . Thyroid condition     Past Surgical History:  Procedure Laterality Date  .  ureatha sugery    . BLADDER REPAIR W/ CESAREAN SECTION     c section times 2  . COLONOSCOPY    . LITHOTRIPSY     x2  . NASAL TURBINATE REDUCTION    . ORIF FINGER FRACTURE  01/23/2012   Procedure: OPEN REDUCTION INTERNAL FIXATION (ORIF) METACARPAL (FINGER) FRACTURE;  Surgeon: Cammie Sickle., MD;  Location: [facility];  Service: Orthopedics;  Laterality: Left;  left small proximal interphalangeal fracture  . RHINOPLASTY    . SINUS EXPLORATION      Family History  Problem Relation Age of Onset  . Diabetes Father   . Hypertension Father   . Heart disease Father    Social History:  reports that she has never smoked. She has never used smokeless tobacco. She reports that she drinks alcohol. She reports that she does not use drugs.  Allergies:  Allergies  Allergen Reactions  . No Known Allergies     Medications Prior to Admission  Medication Sig Dispense Refill  . cetirizine (ZYRTEC) 10 MG tablet Take 10 mg by mouth at bedtime.     . Cholecalciferol (VITAMIN D3) 2000 units TABS Take 2,000 Units by mouth at bedtime.    Marland Kitchen estradiol (VIVELLE-DOT) 0.075 MG/24HR Place 0.5 patches onto the skin 2 (two) times a week. Wednesday & Sunday  10  . Ferrous Sulfate Dried 200 (65 Fe) MG TABS Take 200 mg by mouth at bedtime.    . fluticasone (FLONASE) 50 MCG/ACT nasal spray Place 1 spray  into both nostrils at bedtime.  11  . folic acid (FOLVITE) 1 MG tablet Take 2 mg by mouth at bedtime.   2  . ibuprofen (ADVIL,MOTRIN) 200 MG tablet Take 400 mg by mouth every 8 (eight) hours as needed (for headaches.).    Marland Kitchen levothyroxine (SYNTHROID, LEVOTHROID) 100 MCG tablet Take 100 mcg by mouth daily before breakfast.   1  . Magnesium 250 MG TABS Take 500 mg by mouth at bedtime.     . methotrexate (RHEUMATREX) 2.5 MG tablet Take 20 mg by mouth every Tuesday at 6 PM. Taking 8 tabs every Tuesday  0  . naproxen sodium (ANAPROX) 220 MG tablet Take 220 mg by mouth every 8 (eight) hours as needed (for headaches.).    Marland Kitchen omeprazole (PRILOSEC) 20 MG capsule Take 20 mg by mouth at bedtime.     . verapamil (VERELAN PM) 120 MG 24 hr capsule Take 120 mg by mouth at bedtime.   1  . Golimumab ([place] ARIA IV) Inject 1 Dose into the vein every 8 (eight) weeks.    . progesterone (PROMETRIUM) 200 MG capsule Take 200 mg by mouth See admin instructions. TAKE 1 CAPSULE (200 MG) BY MOUTH DAILY FOR 14 DAYS EVERY OTHER MONTH  6  No results found for this or any previous visit (from the past 48 hour(s)). No results found.  Review of Systems  Constitutional: Negative.   HENT: Negative.     Blood pressure (!) 143/73, pulse 69, temperature 98.3 F (36.8 C), temperature source Oral, resp. rate 18, weight 85.7 kg (189 lb), last menstrual period 07/23/2011, SpO2 97 %. Physical Exam  Constitutional: She appears well-developed and well-nourished.  Neck: Normal range of motion. Neck supple.  Cardiovascular: Normal rate.   Respiratory: Effort normal.     Assessment/Plan Adm for OP endoscopic MT resection and IT reduction.  Sadarius Norman, MD 05/22/2017, 7:25 AM

## 2017-05-22 NOTE — Anesthesia Procedure Notes (Addendum)
Procedure Name: Intubation Date/Time: 05/22/2017 7:44 AM Performed by: Scheryl Darter Pre-anesthesia Checklist: Patient identified, Emergency Drugs available, Suction available and Patient being monitored Patient Re-evaluated:Patient Re-evaluated prior to inductionOxygen Delivery Method: Circle System Utilized Preoxygenation: Pre-oxygenation with 100% oxygen Intubation Type: IV induction Ventilation: Mask ventilation without difficulty Laryngoscope Size: Miller and 2 Tube type: Oral Tube size: 7.0 mm Number of attempts: 1 Airway Equipment and Method: Oral airway and Rigid stylet Placement Confirmation: ETT inserted through vocal cords under direct vision,  positive ETCO2 and breath sounds checked- equal and bilateral Secured at: 22 cm Tube secured with: Tape Dental Injury: Teeth and Oropharynx as per pre-operative assessment

## 2017-05-25 ENCOUNTER — Encounter (HOSPITAL_COMMUNITY): Payer: Self-pay | Admitting: Otolaryngology

## 2017-05-26 NOTE — Anesthesia Postprocedure Evaluation (Signed)
Anesthesia Post Note  Patient: Valerie Pacheco  Procedure(s) Performed: Procedure(s) (LRB): BILATERAL ENDOSCOPIC RESECTION OF CONCHA BULLOSA WITH FUSION SCAN (Bilateral) BILATERAL INFERIOR TURBINATE REDUCTION (Bilateral)     Patient location during evaluation: PACU Anesthesia Type: General Level of consciousness: awake and alert Pain management: pain level controlled Vital Signs Assessment: post-procedure vital signs reviewed and stable Respiratory status: spontaneous breathing, nonlabored ventilation, respiratory function stable and patient connected to nasal cannula oxygen Cardiovascular status: blood pressure returned to baseline and stable Postop Assessment: no signs of nausea or vomiting Anesthetic complications: no    Last Vitals:  Vitals:   05/22/17 0922 05/22/17 0930  BP:  135/77  Pulse: 76 78  Resp: 15 12  Temp:  36.4 C    Last Pain:  Vitals:   05/22/17 0930  TempSrc:   PainSc: 0-No pain                 Chonte Ricke

## 2017-06-04 DIAGNOSIS — M0589 Other rheumatoid arthritis with rheumatoid factor of multiple sites: Secondary | ICD-10-CM | POA: Diagnosis not present

## 2017-06-04 DIAGNOSIS — Z79899 Other long term (current) drug therapy: Secondary | ICD-10-CM | POA: Diagnosis not present

## 2017-06-08 ENCOUNTER — Ambulatory Visit
Admission: RE | Admit: 2017-06-08 | Discharge: 2017-06-08 | Disposition: A | Discharge status: Home / Self Care | Payer: BLUE CROSS/BLUE SHIELD | Source: Ambulatory Visit | Attending: Gynecology | Admitting: Gynecology

## 2017-06-08 DIAGNOSIS — Z1231 Encounter for screening mammogram for malignant neoplasm of breast: Secondary | ICD-10-CM

## 2017-06-09 ENCOUNTER — Other Ambulatory Visit: Payer: Self-pay | Admitting: Gynecology

## 2017-06-09 DIAGNOSIS — R928 Other abnormal and inconclusive findings on diagnostic imaging of breast: Secondary | ICD-10-CM

## 2017-06-15 ENCOUNTER — Ambulatory Visit
Admission: RE | Admit: 2017-06-15 | Discharge: 2017-06-15 | Disposition: A | Discharge status: Home / Self Care | Payer: BLUE CROSS/BLUE SHIELD | Source: Ambulatory Visit | Attending: Gynecology | Admitting: Gynecology

## 2017-06-15 ENCOUNTER — Other Ambulatory Visit: Payer: Self-pay | Admitting: Gynecology

## 2017-06-15 DIAGNOSIS — R928 Other abnormal and inconclusive findings on diagnostic imaging of breast: Secondary | ICD-10-CM

## 2017-06-15 DIAGNOSIS — R922 Inconclusive mammogram: Secondary | ICD-10-CM | POA: Diagnosis not present

## 2017-06-15 DIAGNOSIS — N63 Unspecified lump in unspecified breast: Secondary | ICD-10-CM

## 2017-06-15 DIAGNOSIS — N6489 Other specified disorders of breast: Secondary | ICD-10-CM | POA: Diagnosis not present

## 2017-07-30 DIAGNOSIS — M0589 Other rheumatoid arthritis with rheumatoid factor of multiple sites: Secondary | ICD-10-CM | POA: Diagnosis not present

## 2017-08-13 ENCOUNTER — Encounter: Payer: Self-pay | Admitting: Pulmonary Disease

## 2017-08-13 ENCOUNTER — Ambulatory Visit (INDEPENDENT_AMBULATORY_CARE_PROVIDER_SITE_OTHER): Payer: BLUE CROSS/BLUE SHIELD | Admitting: Pulmonary Disease

## 2017-08-13 ENCOUNTER — Ambulatory Visit (HOSPITAL_BASED_OUTPATIENT_CLINIC_OR_DEPARTMENT_OTHER)
Admission: RE | Admit: 2017-08-13 | Discharge: 2017-08-13 | Disposition: A | Discharge status: Home / Self Care | Payer: BLUE CROSS/BLUE SHIELD | Source: Ambulatory Visit | Attending: Pulmonary Disease | Admitting: Pulmonary Disease

## 2017-08-13 VITALS — BP 126/86 | HR 83 | Ht 65.5 in | Wt 189.0 lb

## 2017-08-13 DIAGNOSIS — G4734 Idiopathic sleep related nonobstructive alveolar hypoventilation: Secondary | ICD-10-CM | POA: Insufficient documentation

## 2017-08-13 DIAGNOSIS — R0902 Hypoxemia: Secondary | ICD-10-CM | POA: Diagnosis not present

## 2017-08-13 NOTE — Patient Instructions (Signed)
Chest x-ray today Repeat nocturnal oximetry to check oxygen levels during sleep-if this is abnormal then we will pursue breathing test

## 2017-08-13 NOTE — Assessment & Plan Note (Addendum)
Chest x-ray today to look for lung parenchymal disease especially rheumatoid arthritis to related fibrosis   Repeat nocturnal oximetry to check oxygen levels during sleep-if this is abnormal then we will pursue breathing test . Differential diagnosis includes pulmonary hypertension-but we will pursue workup in that direction only if we can reproduce nocturnal hypoxia on nocturnal oximetry

## 2017-08-13 NOTE — Progress Notes (Signed)
Subjective:    Patient ID: Valerie Pacheco, female    DOB: 1961-07-01, 56 y.o.   MRN: [id number]  HPI  Chief Complaint  Patient presents with  . Sleep Consult    Low oxygen levels reported on sleep study. Denies any gasping of air during the night or any choking.     56 year old nonsmoker with rheumatoid arthritis referred for evaluation of nocturnal hypoxemia. She underwent sleep evaluation by neurology due to loud snoring been noted by family members. She reported excessive daytime fatigue. Home sleep study did not show any evidence of obstructive sleep apnea, AHI was 2/hour but showed oxygen desaturation less than 90% for 411 minutes and less than 85% for 36 minutes. Oxygen desaturation index was 3/hour.  She reports a childhood history of asthma which she grew out of by the time she was a teenager. She had severe allergies from age [age] and needed allergy shots. She has hypothyroidism and takes thyroid supplements. She was diagnosed with rheumatoid arthritis in 2016 and is on methotrexate and simponi shots by dr Trudie Reed. She underwent rhinoplasty in 1979 and turbinate reduction in the 90s and again in 05/2017  She denies shortness of breath, cough or wheezing. She denies pedal edema, orthopnea or paroxysmal nocturnal dyspnea.        Past Medical History:  Diagnosis Date  . Arthritis   . Chronic kidney disease    multiple kidney stones  . GERD (gastroesophageal reflux disease)   . Headache(784.0)   . Heart murmur    was told in the 1990's, but not since  . History of kidney stones   . Hypertension    when pregnacy  . Hypothyroidism   . Rheumatoid arthritis ([place])   . Seasonal allergies   . Sinus disorder   . Thyroid condition      Past Surgical History:  Procedure Laterality Date  .  ureatha sugery    . BLADDER REPAIR W/ CESAREAN SECTION     c section times 2  . COLONOSCOPY    . LITHOTRIPSY     x2  . NASAL TURBINATE REDUCTION    . ORIF FINGER FRACTURE   01/23/2012   Procedure: OPEN REDUCTION INTERNAL FIXATION (ORIF) METACARPAL (FINGER) FRACTURE;  Surgeon: Cammie Sickle., MD;  Location: [facility];  Service: Orthopedics;  Laterality: Left;  left small proximal interphalangeal fracture  . RHINOPLASTY    . SINUS ENDO WITH FUSION Bilateral 05/22/2017   Procedure: BILATERAL ENDOSCOPIC RESECTION OF CONCHA BULLOSA WITH FUSION SCAN;  Surgeon: Jerrell Belfast, MD;  Location: [facility];  Service: ENT;  Laterality: Bilateral;  . SINUS EXPLORATION    . TURBINATE REDUCTION Bilateral 05/22/2017   Procedure: BILATERAL INFERIOR TURBINATE REDUCTION;  Surgeon: Jerrell Belfast, MD;  Location: [facility];  Service: ENT;  Laterality: Bilateral;    Allergies  Allergen Reactions  . No Known Allergies     Social History   Social History  . Marital status: Married    Spouse name: N/A  . Number of children: 2  . Years of education: 15   Occupational History  .      office    Social History Main Topics  . Smoking status: Never Smoker  . Smokeless tobacco: Never Used  . Alcohol use Yes     Comment: occ  . Drug use: No  . Sexual activity: Not on file   Other Topics Concern  . Not on file   Social History Narrative   Lives with  husband   Caffeine- ice tea every other day     Family History  Problem Relation Age of Onset  . Diabetes Father   . Hypertension Father   . Heart disease Father      Review of Systems Constitutional: negative for anorexia, fevers and sweats  Eyes: negative for irritation, redness and visual disturbance  Ears, nose, mouth, throat, and face: negative for earaches, epistaxis, nasal congestion and sore throat  Respiratory: negative for cough, dyspnea on exertion, sputum and wheezing  Cardiovascular: negative for chest pain, dyspnea, lower extremity edema, orthopnea, palpitations and syncope  Gastrointestinal: negative for abdominal pain, constipation, diarrhea, melena, nausea and vomiting    Genitourinary:negative for dysuria, frequency and hematuria  Hematologic/lymphatic: negative for bleeding, easy bruising and lymphadenopathy  Musculoskeletal:negative for arthralgias, muscle weakness and stiff joints  Neurological: negative for coordination problems, gait problems, headaches and weakness  Endocrine: negative for diabetic symptoms including polydipsia, polyuria and weight loss     Objective:   Physical Exam  Gen. Pleasant, well-nourished, in no distress, normal affect ENT - no lesions, no post nasal drip Neck: No JVD, no thyromegaly, no carotid bruits Lungs: no use of accessory muscles, no dullness to percussion, clear without rales or rhonchi  Cardiovascular: Rhythm regular, heart sounds  normal, no murmurs or gallops, no peripheral edema Abdomen: soft and non-tender, no hepatosplenomegaly, BS normal. Musculoskeletal: No deformities, no cyanosis or clubbing Neuro:  alert, non focal       Assessment & Plan:

## 2017-08-18 ENCOUNTER — Institutional Professional Consult (permissible substitution): Payer: BLUE CROSS/BLUE SHIELD | Admitting: Internal Medicine

## 2017-08-25 DIAGNOSIS — G4734 Idiopathic sleep related nonobstructive alveolar hypoventilation: Secondary | ICD-10-CM | POA: Diagnosis not present

## 2017-09-02 DIAGNOSIS — G43019 Migraine without aura, intractable, without status migrainosus: Secondary | ICD-10-CM | POA: Diagnosis not present

## 2017-09-02 DIAGNOSIS — G43719 Chronic migraine without aura, intractable, without status migrainosus: Secondary | ICD-10-CM | POA: Diagnosis not present

## 2017-09-04 ENCOUNTER — Telehealth: Payer: Self-pay | Admitting: Pulmonary Disease

## 2017-09-04 DIAGNOSIS — G4734 Idiopathic sleep related nonobstructive alveolar hypoventilation: Secondary | ICD-10-CM

## 2017-09-04 NOTE — Telephone Encounter (Signed)
Received ONO on room air results from Person. Per RA, patient does not need to use o2 at present. He wants her to have a PFT as the next step.   Spoke with patient. She is aware of results. Will proceed with PFTs. Order has been placed. Nothing else needed at time of call.

## 2017-09-07 DIAGNOSIS — Z0000 Encounter for general adult medical examination without abnormal findings: Secondary | ICD-10-CM | POA: Diagnosis not present

## 2017-09-07 DIAGNOSIS — Z23 Encounter for immunization: Secondary | ICD-10-CM | POA: Diagnosis not present

## 2017-09-09 DIAGNOSIS — J029 Acute pharyngitis, unspecified: Secondary | ICD-10-CM | POA: Diagnosis not present

## 2017-09-14 DIAGNOSIS — M0589 Other rheumatoid arthritis with rheumatoid factor of multiple sites: Secondary | ICD-10-CM | POA: Diagnosis not present

## 2017-09-14 DIAGNOSIS — M2550 Pain in unspecified joint: Secondary | ICD-10-CM | POA: Diagnosis not present

## 2017-09-14 DIAGNOSIS — Z79899 Other long term (current) drug therapy: Secondary | ICD-10-CM | POA: Diagnosis not present

## 2017-09-23 DIAGNOSIS — J988 Other specified respiratory disorders: Secondary | ICD-10-CM | POA: Diagnosis not present

## 2017-10-05 DIAGNOSIS — M0589 Other rheumatoid arthritis with rheumatoid factor of multiple sites: Secondary | ICD-10-CM | POA: Diagnosis not present

## 2017-10-05 DIAGNOSIS — Z79899 Other long term (current) drug therapy: Secondary | ICD-10-CM | POA: Diagnosis not present

## 2017-11-05 DIAGNOSIS — Z1389 Encounter for screening for other disorder: Secondary | ICD-10-CM | POA: Diagnosis not present

## 2017-11-05 DIAGNOSIS — N898 Other specified noninflammatory disorders of vagina: Secondary | ICD-10-CM | POA: Diagnosis not present

## 2017-11-05 DIAGNOSIS — Z780 Asymptomatic menopausal state: Secondary | ICD-10-CM | POA: Diagnosis not present

## 2017-11-05 DIAGNOSIS — Z130 Encounter for screening for diseases of the blood and blood-forming organs and certain disorders involving the immune mechanism: Secondary | ICD-10-CM | POA: Diagnosis not present

## 2017-11-05 DIAGNOSIS — Z79890 Hormone replacement therapy (postmenopausal): Secondary | ICD-10-CM | POA: Diagnosis not present

## 2017-11-05 DIAGNOSIS — Z01419 Encounter for gynecological examination (general) (routine) without abnormal findings: Secondary | ICD-10-CM | POA: Diagnosis not present

## 2017-11-05 DIAGNOSIS — Z6830 Body mass index (BMI) 30.0-30.9, adult: Secondary | ICD-10-CM | POA: Diagnosis not present

## 2017-12-07 DIAGNOSIS — M0589 Other rheumatoid arthritis with rheumatoid factor of multiple sites: Secondary | ICD-10-CM | POA: Diagnosis not present

## 2017-12-18 ENCOUNTER — Other Ambulatory Visit: Payer: Self-pay | Admitting: Gynecology

## 2017-12-18 ENCOUNTER — Ambulatory Visit
Admission: RE | Admit: 2017-12-18 | Discharge: 2017-12-18 | Disposition: A | Discharge status: Home / Self Care | Payer: BLUE CROSS/BLUE SHIELD | Source: Ambulatory Visit | Attending: Gynecology | Admitting: Gynecology

## 2017-12-18 DIAGNOSIS — N63 Unspecified lump in unspecified breast: Secondary | ICD-10-CM

## 2017-12-18 DIAGNOSIS — N6011 Diffuse cystic mastopathy of right breast: Secondary | ICD-10-CM | POA: Diagnosis not present

## 2017-12-18 DIAGNOSIS — R922 Inconclusive mammogram: Secondary | ICD-10-CM | POA: Diagnosis not present

## 2017-12-24 DIAGNOSIS — M9903 Segmental and somatic dysfunction of lumbar region: Secondary | ICD-10-CM | POA: Diagnosis not present

## 2017-12-24 DIAGNOSIS — M62830 Muscle spasm of back: Secondary | ICD-10-CM | POA: Diagnosis not present

## 2017-12-24 DIAGNOSIS — M5417 Radiculopathy, lumbosacral region: Secondary | ICD-10-CM | POA: Diagnosis not present

## 2017-12-25 DIAGNOSIS — M5417 Radiculopathy, lumbosacral region: Secondary | ICD-10-CM | POA: Diagnosis not present

## 2017-12-25 DIAGNOSIS — M9903 Segmental and somatic dysfunction of lumbar region: Secondary | ICD-10-CM | POA: Diagnosis not present

## 2017-12-25 DIAGNOSIS — M62830 Muscle spasm of back: Secondary | ICD-10-CM | POA: Diagnosis not present

## 2017-12-29 DIAGNOSIS — M9903 Segmental and somatic dysfunction of lumbar region: Secondary | ICD-10-CM | POA: Diagnosis not present

## 2017-12-29 DIAGNOSIS — M5417 Radiculopathy, lumbosacral region: Secondary | ICD-10-CM | POA: Diagnosis not present

## 2017-12-29 DIAGNOSIS — M62830 Muscle spasm of back: Secondary | ICD-10-CM | POA: Diagnosis not present

## 2017-12-31 DIAGNOSIS — M5417 Radiculopathy, lumbosacral region: Secondary | ICD-10-CM | POA: Diagnosis not present

## 2017-12-31 DIAGNOSIS — M62830 Muscle spasm of back: Secondary | ICD-10-CM | POA: Diagnosis not present

## 2017-12-31 DIAGNOSIS — M545 Low back pain: Secondary | ICD-10-CM | POA: Diagnosis not present

## 2017-12-31 DIAGNOSIS — M9903 Segmental and somatic dysfunction of lumbar region: Secondary | ICD-10-CM | POA: Diagnosis not present

## 2018-01-04 DIAGNOSIS — M0589 Other rheumatoid arthritis with rheumatoid factor of multiple sites: Secondary | ICD-10-CM | POA: Diagnosis not present

## 2018-01-04 DIAGNOSIS — M2550 Pain in unspecified joint: Secondary | ICD-10-CM | POA: Diagnosis not present

## 2018-01-04 DIAGNOSIS — Z79899 Other long term (current) drug therapy: Secondary | ICD-10-CM | POA: Diagnosis not present

## 2018-01-06 DIAGNOSIS — M9903 Segmental and somatic dysfunction of lumbar region: Secondary | ICD-10-CM | POA: Diagnosis not present

## 2018-01-06 DIAGNOSIS — M545 Low back pain: Secondary | ICD-10-CM | POA: Diagnosis not present

## 2018-01-06 DIAGNOSIS — M62830 Muscle spasm of back: Secondary | ICD-10-CM | POA: Diagnosis not present

## 2018-01-06 DIAGNOSIS — M5417 Radiculopathy, lumbosacral region: Secondary | ICD-10-CM | POA: Diagnosis not present

## 2018-02-01 DIAGNOSIS — Z79899 Other long term (current) drug therapy: Secondary | ICD-10-CM | POA: Diagnosis not present

## 2018-02-01 DIAGNOSIS — M0589 Other rheumatoid arthritis with rheumatoid factor of multiple sites: Secondary | ICD-10-CM | POA: Diagnosis not present

## 2018-02-04 DIAGNOSIS — M62830 Muscle spasm of back: Secondary | ICD-10-CM | POA: Diagnosis not present

## 2018-02-04 DIAGNOSIS — M545 Low back pain: Secondary | ICD-10-CM | POA: Diagnosis not present

## 2018-02-04 DIAGNOSIS — M5417 Radiculopathy, lumbosacral region: Secondary | ICD-10-CM | POA: Diagnosis not present

## 2018-02-04 DIAGNOSIS — M9903 Segmental and somatic dysfunction of lumbar region: Secondary | ICD-10-CM | POA: Diagnosis not present

## 2018-03-03 DIAGNOSIS — L821 Other seborrheic keratosis: Secondary | ICD-10-CM | POA: Diagnosis not present

## 2018-03-03 DIAGNOSIS — D229 Melanocytic nevi, unspecified: Secondary | ICD-10-CM | POA: Diagnosis not present

## 2018-03-03 DIAGNOSIS — D485 Neoplasm of uncertain behavior of skin: Secondary | ICD-10-CM | POA: Diagnosis not present

## 2018-03-09 DIAGNOSIS — G43019 Migraine without aura, intractable, without status migrainosus: Secondary | ICD-10-CM | POA: Diagnosis not present

## 2018-03-09 DIAGNOSIS — G43719 Chronic migraine without aura, intractable, without status migrainosus: Secondary | ICD-10-CM | POA: Diagnosis not present

## 2018-03-11 DIAGNOSIS — M5417 Radiculopathy, lumbosacral region: Secondary | ICD-10-CM | POA: Diagnosis not present

## 2018-03-11 DIAGNOSIS — M62830 Muscle spasm of back: Secondary | ICD-10-CM | POA: Diagnosis not present

## 2018-03-11 DIAGNOSIS — M545 Low back pain: Secondary | ICD-10-CM | POA: Diagnosis not present

## 2018-03-11 DIAGNOSIS — M9903 Segmental and somatic dysfunction of lumbar region: Secondary | ICD-10-CM | POA: Diagnosis not present

## 2018-04-01 DIAGNOSIS — M0589 Other rheumatoid arthritis with rheumatoid factor of multiple sites: Secondary | ICD-10-CM | POA: Diagnosis not present

## 2018-04-08 DIAGNOSIS — M545 Low back pain: Secondary | ICD-10-CM | POA: Diagnosis not present

## 2018-04-08 DIAGNOSIS — M5417 Radiculopathy, lumbosacral region: Secondary | ICD-10-CM | POA: Diagnosis not present

## 2018-04-08 DIAGNOSIS — M9903 Segmental and somatic dysfunction of lumbar region: Secondary | ICD-10-CM | POA: Diagnosis not present

## 2018-04-08 DIAGNOSIS — M62830 Muscle spasm of back: Secondary | ICD-10-CM | POA: Diagnosis not present

## 2018-05-04 DIAGNOSIS — M0589 Other rheumatoid arthritis with rheumatoid factor of multiple sites: Secondary | ICD-10-CM | POA: Diagnosis not present

## 2018-05-04 DIAGNOSIS — M5417 Radiculopathy, lumbosacral region: Secondary | ICD-10-CM | POA: Diagnosis not present

## 2018-05-04 DIAGNOSIS — M545 Low back pain: Secondary | ICD-10-CM | POA: Diagnosis not present

## 2018-05-04 DIAGNOSIS — M62830 Muscle spasm of back: Secondary | ICD-10-CM | POA: Diagnosis not present

## 2018-05-04 DIAGNOSIS — M9903 Segmental and somatic dysfunction of lumbar region: Secondary | ICD-10-CM | POA: Diagnosis not present

## 2018-05-04 DIAGNOSIS — Z79899 Other long term (current) drug therapy: Secondary | ICD-10-CM | POA: Diagnosis not present

## 2018-05-04 DIAGNOSIS — M2550 Pain in unspecified joint: Secondary | ICD-10-CM | POA: Diagnosis not present

## 2018-05-05 DIAGNOSIS — J22 Unspecified acute lower respiratory infection: Secondary | ICD-10-CM | POA: Diagnosis not present

## 2018-05-13 DIAGNOSIS — M25562 Pain in left knee: Secondary | ICD-10-CM | POA: Diagnosis not present

## 2018-05-27 DIAGNOSIS — M0589 Other rheumatoid arthritis with rheumatoid factor of multiple sites: Secondary | ICD-10-CM | POA: Diagnosis not present

## 2018-05-27 DIAGNOSIS — Z79899 Other long term (current) drug therapy: Secondary | ICD-10-CM | POA: Diagnosis not present

## 2018-06-01 DIAGNOSIS — M5417 Radiculopathy, lumbosacral region: Secondary | ICD-10-CM | POA: Diagnosis not present

## 2018-06-01 DIAGNOSIS — M9903 Segmental and somatic dysfunction of lumbar region: Secondary | ICD-10-CM | POA: Diagnosis not present

## 2018-06-17 ENCOUNTER — Other Ambulatory Visit: Payer: Self-pay | Admitting: Gynecology

## 2018-06-17 ENCOUNTER — Other Ambulatory Visit: Payer: BLUE CROSS/BLUE SHIELD

## 2018-06-17 ENCOUNTER — Ambulatory Visit
Admission: RE | Admit: 2018-06-17 | Discharge: 2018-06-17 | Disposition: A | Discharge status: Home / Self Care | Payer: BLUE CROSS/BLUE SHIELD | Source: Ambulatory Visit | Attending: Gynecology | Admitting: Gynecology

## 2018-06-17 DIAGNOSIS — N63 Unspecified lump in unspecified breast: Secondary | ICD-10-CM

## 2018-06-17 DIAGNOSIS — R922 Inconclusive mammogram: Secondary | ICD-10-CM | POA: Diagnosis not present

## 2018-06-17 DIAGNOSIS — N6011 Diffuse cystic mastopathy of right breast: Secondary | ICD-10-CM | POA: Diagnosis not present

## 2018-06-23 ENCOUNTER — Ambulatory Visit
Admission: RE | Admit: 2018-06-23 | Discharge: 2018-06-23 | Disposition: A | Discharge status: Home / Self Care | Payer: BLUE CROSS/BLUE SHIELD | Source: Ambulatory Visit | Attending: Gynecology | Admitting: Gynecology

## 2018-06-23 DIAGNOSIS — N6011 Diffuse cystic mastopathy of right breast: Secondary | ICD-10-CM | POA: Diagnosis not present

## 2018-06-23 DIAGNOSIS — N63 Unspecified lump in unspecified breast: Secondary | ICD-10-CM

## 2018-06-23 DIAGNOSIS — N6489 Other specified disorders of breast: Secondary | ICD-10-CM | POA: Diagnosis not present

## 2018-06-23 HISTORY — PX: BREAST BIOPSY: SHX20

## 2018-06-29 DIAGNOSIS — M5417 Radiculopathy, lumbosacral region: Secondary | ICD-10-CM | POA: Diagnosis not present

## 2018-06-29 DIAGNOSIS — M9903 Segmental and somatic dysfunction of lumbar region: Secondary | ICD-10-CM | POA: Diagnosis not present

## 2018-06-29 DIAGNOSIS — M545 Low back pain: Secondary | ICD-10-CM | POA: Diagnosis not present

## 2018-06-29 DIAGNOSIS — M62830 Muscle spasm of back: Secondary | ICD-10-CM | POA: Diagnosis not present

## 2018-07-22 DIAGNOSIS — Z79899 Other long term (current) drug therapy: Secondary | ICD-10-CM | POA: Diagnosis not present

## 2018-07-22 DIAGNOSIS — M0589 Other rheumatoid arthritis with rheumatoid factor of multiple sites: Secondary | ICD-10-CM | POA: Diagnosis not present

## 2018-07-27 DIAGNOSIS — M545 Low back pain: Secondary | ICD-10-CM | POA: Diagnosis not present

## 2018-07-27 DIAGNOSIS — M9903 Segmental and somatic dysfunction of lumbar region: Secondary | ICD-10-CM | POA: Diagnosis not present

## 2018-07-27 DIAGNOSIS — M62830 Muscle spasm of back: Secondary | ICD-10-CM | POA: Diagnosis not present

## 2018-08-24 DIAGNOSIS — M62830 Muscle spasm of back: Secondary | ICD-10-CM | POA: Diagnosis not present

## 2018-08-24 DIAGNOSIS — M9903 Segmental and somatic dysfunction of lumbar region: Secondary | ICD-10-CM | POA: Diagnosis not present

## 2018-09-13 DIAGNOSIS — G43019 Migraine without aura, intractable, without status migrainosus: Secondary | ICD-10-CM | POA: Diagnosis not present

## 2018-09-13 DIAGNOSIS — G43719 Chronic migraine without aura, intractable, without status migrainosus: Secondary | ICD-10-CM | POA: Diagnosis not present

## 2018-09-21 DIAGNOSIS — M62830 Muscle spasm of back: Secondary | ICD-10-CM | POA: Diagnosis not present

## 2018-09-21 DIAGNOSIS — M545 Low back pain: Secondary | ICD-10-CM | POA: Diagnosis not present

## 2018-09-21 DIAGNOSIS — M5417 Radiculopathy, lumbosacral region: Secondary | ICD-10-CM | POA: Diagnosis not present

## 2018-09-21 DIAGNOSIS — M9903 Segmental and somatic dysfunction of lumbar region: Secondary | ICD-10-CM | POA: Diagnosis not present

## 2018-09-23 DIAGNOSIS — M0589 Other rheumatoid arthritis with rheumatoid factor of multiple sites: Secondary | ICD-10-CM | POA: Diagnosis not present

## 2018-09-23 DIAGNOSIS — Z79899 Other long term (current) drug therapy: Secondary | ICD-10-CM | POA: Diagnosis not present

## 2018-10-06 DIAGNOSIS — Z0000 Encounter for general adult medical examination without abnormal findings: Secondary | ICD-10-CM | POA: Diagnosis not present

## 2018-10-11 DIAGNOSIS — G43019 Migraine without aura, intractable, without status migrainosus: Secondary | ICD-10-CM | POA: Diagnosis not present

## 2018-10-11 DIAGNOSIS — M791 Myalgia, unspecified site: Secondary | ICD-10-CM | POA: Diagnosis not present

## 2018-10-11 DIAGNOSIS — G43719 Chronic migraine without aura, intractable, without status migrainosus: Secondary | ICD-10-CM | POA: Diagnosis not present

## 2018-10-11 DIAGNOSIS — M542 Cervicalgia: Secondary | ICD-10-CM | POA: Diagnosis not present

## 2018-10-11 DIAGNOSIS — G518 Other disorders of facial nerve: Secondary | ICD-10-CM | POA: Diagnosis not present

## 2018-10-14 ENCOUNTER — Encounter (HOSPITAL_BASED_OUTPATIENT_CLINIC_OR_DEPARTMENT_OTHER): Payer: Self-pay | Admitting: Adult Health

## 2018-10-14 ENCOUNTER — Emergency Department (HOSPITAL_BASED_OUTPATIENT_CLINIC_OR_DEPARTMENT_OTHER)
Admission: EM | Admit: 2018-10-14 | Discharge: 2018-10-14 | Disposition: A | Discharge status: Home / Self Care | Payer: BLUE CROSS/BLUE SHIELD | Attending: Emergency Medicine | Admitting: Emergency Medicine

## 2018-10-14 ENCOUNTER — Other Ambulatory Visit: Payer: Self-pay

## 2018-10-14 ENCOUNTER — Emergency Department (HOSPITAL_BASED_OUTPATIENT_CLINIC_OR_DEPARTMENT_OTHER): Payer: BLUE CROSS/BLUE SHIELD

## 2018-10-14 DIAGNOSIS — N189 Chronic kidney disease, unspecified: Secondary | ICD-10-CM | POA: Diagnosis not present

## 2018-10-14 DIAGNOSIS — E039 Hypothyroidism, unspecified: Secondary | ICD-10-CM | POA: Insufficient documentation

## 2018-10-14 DIAGNOSIS — M79602 Pain in left arm: Secondary | ICD-10-CM | POA: Insufficient documentation

## 2018-10-14 DIAGNOSIS — I517 Cardiomegaly: Secondary | ICD-10-CM | POA: Diagnosis not present

## 2018-10-14 DIAGNOSIS — M25512 Pain in left shoulder: Secondary | ICD-10-CM | POA: Diagnosis not present

## 2018-10-14 DIAGNOSIS — Z79899 Other long term (current) drug therapy: Secondary | ICD-10-CM | POA: Diagnosis not present

## 2018-10-14 DIAGNOSIS — I129 Hypertensive chronic kidney disease with stage 1 through stage 4 chronic kidney disease, or unspecified chronic kidney disease: Secondary | ICD-10-CM | POA: Diagnosis not present

## 2018-10-14 HISTORY — DX: Migraine, unspecified, not intractable, without status migrainosus: G43.909

## 2018-10-14 LAB — COMPREHENSIVE METABOLIC PANEL
ALT: 17 U/L (ref 0–44)
AST: 21 U/L (ref 15–41)
Albumin: 4.2 g/dL (ref 3.5–5.0)
Alkaline Phosphatase: 64 U/L (ref 38–126)
Anion gap: 8 (ref 5–15)
BUN: 17 mg/dL (ref 6–20)
CO2: 26 mmol/L (ref 22–32)
CREATININE: 0.74 mg/dL (ref 0.44–1.00)
Calcium: 9.2 mg/dL (ref 8.9–10.3)
Chloride: 102 mmol/L (ref 98–111)
GFR calc non Af Amer: >60 mL/min (ref >60)
Glucose, Bld: 148 mg/dL — ABNORMAL HIGH (ref 70–99)
Potassium: 3.3 mmol/L — ABNORMAL LOW (ref 3.5–5.1)
SODIUM: 136 mmol/L (ref 135–145)
Total Bilirubin: 0.5 mg/dL (ref 0.3–1.2)
Total Protein: 7.1 g/dL (ref 6.5–8.1)

## 2018-10-14 LAB — CBC WITH DIFFERENTIAL/PLATELET
ABS IMMATURE GRANULOCYTES: 0.02 10*3/uL (ref 0.00–0.07)
BASOS PCT: 1 %
Basophils Absolute: 0 10*3/uL (ref 0.0–0.1)
EOS ABS: 0.2 10*3/uL (ref 0.0–0.5)
Eosinophils Relative: 2 %
HCT: 40.5 % (ref 36.0–46.0)
Hemoglobin: 13.6 g/dL (ref 12.0–15.0)
Immature Granulocytes: 0 %
Lymphocytes Relative: 27 %
Lymphs Abs: 2.1 10*3/uL (ref 0.7–4.0)
MCH: 31.1 pg (ref 26.0–34.0)
MCHC: 33.6 g/dL (ref 30.0–36.0)
MCV: 92.7 fL (ref 80.0–100.0)
Monocytes Absolute: 0.5 10*3/uL (ref 0.1–1.0)
Monocytes Relative: 7 %
NEUTROS ABS: 4.9 10*3/uL (ref 1.7–7.7)
NEUTROS PCT: 63 %
NRBC: 0 % (ref 0.0–0.2)
PLATELETS: 233 10*3/uL (ref 150–400)
RBC: 4.37 MIL/uL (ref 3.87–5.11)
RDW: 13.7 % (ref 11.5–15.5)
WBC: 7.7 10*3/uL (ref 4.0–10.5)

## 2018-10-14 LAB — TROPONIN I: Troponin I: <0.03 ng/mL (ref <0.03)

## 2018-10-14 NOTE — ED Notes (Signed)
Patient transported to x-ray. ?

## 2018-10-14 NOTE — ED Triage Notes (Signed)
PResents with left arm and shoulder pain that began at 1:40 while sitting at her desk typing. The pain is described as throbbing. NOthing makes pain better, nothing makes pain worse. Denies nausea, dizziness, SOB and blurred vision. She has never had this pain before. The pain is constant.

## 2018-10-14 NOTE — ED Provider Notes (Signed)
[facility] EMERGENCY DEPARTMENT Provider Note   CSN: [id number] Arrival date & time: 10/14/18  1458     History   Chief Complaint Chief Complaint  Patient presents with  . Arm Pain    HPI Valerie Pacheco is a 57 y.o. female.  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  Headache improved now  No hx of htn, chol, dm (had gestational htn) Father had heart disease in mid 64s, mom has heart problems, more rhythm problems   Past Medical History:  Diagnosis Date  . Arthritis   . Chronic kidney disease    multiple kidney stones  . GERD (gastroesophageal reflux disease)   . Headache(784.0)   . Heart murmur    was told in the 1990's, but not since  . History of kidney stones   . Hypertension    when pregnacy  . Hypothyroidism   . Migraines   . Rheumatoid arthritis ([place])   . Seasonal allergies   . Sinus disorder   . Thyroid condition     Patient Active Problem List   Diagnosis Date Noted  . Nocturnal hypoxia 08/13/2017  . Nasal turbinate hypertrophy 05/22/2017  . Snoring 02/12/2017  . Fatigue due to sleep pattern disturbance 02/12/2017  . Excessive daytime sleepiness 02/12/2017  . Chronic seasonal allergic rhinitis due to pollen 02/12/2017    Past Surgical History:  Procedure Laterality Date  .  ureatha sugery    . BLADDER REPAIR W/ CESAREAN  SECTION     c section times 2  . COLONOSCOPY    . LITHOTRIPSY     x2  . NASAL TURBINATE REDUCTION    . ORIF FINGER FRACTURE  01/23/2012   Procedure: OPEN REDUCTION INTERNAL FIXATION (ORIF) METACARPAL (FINGER) FRACTURE;  Surgeon: Cammie Sickle., MD;  Location: [facility];  Service: Orthopedics;  Laterality: Left;  left small proximal interphalangeal fracture  . RHINOPLASTY    . SINUS ENDO WITH FUSION Bilateral 05/22/2017   Procedure: BILATERAL ENDOSCOPIC RESECTION OF CONCHA BULLOSA WITH FUSION SCAN;  Surgeon: Jerrell Belfast, MD;  Location: [facility];  Service: ENT;  Laterality: Bilateral;  . SINUS EXPLORATION    . TURBINATE REDUCTION Bilateral 05/22/2017   Procedure: BILATERAL INFERIOR TURBINATE REDUCTION;  Surgeon: Jerrell Belfast, MD;  Location: [facility];  Service: ENT;  Laterality: Bilateral;     OB History   None      Home Medications    Prior to Admission medications   Medication Sig Start Date End Date Taking? Authorizing Provider  cetirizine (ZYRTEC) 10 MG tablet Take 10 mg by mouth at bedtime.     [provider]  Cholecalciferol (VITAMIN D3) 2000 units TABS Take 2,000 Units by mouth at bedtime.    [provider]  estradiol (VIVELLE-DOT) 0.075 MG/24HR Place 0.5 patches onto the skin 2 (two) times a week. Wednesday &  Sunday 04/05/17   [provider]  Ferrous Sulfate Dried 200 (65 Fe) MG TABS Take 200 mg by mouth at bedtime.    [provider]  folic acid (FOLVITE) 1 MG tablet Take 2 mg by mouth at bedtime.  01/28/17   [provider]  Golimumab ([place] ARIA IV) Inject 1 Dose into the vein every 8 (eight) weeks.    [provider]  HYDROcodone-acetaminophen (NORCO) 5-325 MG tablet Take 1-2 tablets by mouth every 6 (six) hours as needed. 05/22/17   Jerrell Belfast, MD  ibuprofen (ADVIL,MOTRIN) 200 MG tablet Take 400 mg by mouth every 8 (eight) hours as needed (for headaches.).    [provider]    levothyroxine (SYNTHROID, LEVOTHROID) 100 MCG tablet Take 100 mcg by mouth daily before breakfast.  11/16/16   [provider]  Magnesium 250 MG TABS Take 500 mg by mouth at bedtime.     [provider]  methotrexate (RHEUMATREX) 2.5 MG tablet Take 20 mg by mouth every Tuesday at 6 PM. Taking 8 tabs every Tuesday 02/06/17   [provider]  naproxen sodium (ANAPROX) 220 MG tablet Take 220 mg by mouth every 8 (eight) hours as needed (for headaches.).    [provider]  omeprazole (PRILOSEC) 20 MG capsule Take 20 mg by mouth at bedtime.     [provider]  progesterone (PROMETRIUM) 200 MG capsule Take 200 mg by mouth See admin instructions. TAKE 1 CAPSULE (200 MG) BY MOUTH DAILY FOR 14 DAYS EVERY OTHER MONTH 02/06/17   [provider]  verapamil (VERELAN PM) 120 MG 24 hr capsule Take 120 mg by mouth at bedtime.  01/20/17   [provider]    Family History Family History  Problem Relation Age of Onset  . Diabetes Father   . Hypertension Father   . Heart disease Father   . Breast cancer Neg Hx     Social History Social History   Tobacco Use  . Smoking status: Never Smoker  . Smokeless tobacco: Never Used  Substance Use Topics  . Alcohol use: Yes    Comment: occ  . Drug use: No     Allergies   No known allergies   Review of Systems Review of Systems  Constitutional: Negative for fever.  HENT: Negative for sore throat.   Eyes: Negative for visual disturbance.  Respiratory: Negative for cough and shortness of breath.   Cardiovascular: Negative for chest pain and leg swelling.  Gastrointestinal: Negative for abdominal pain, nausea and vomiting.  Genitourinary: Negative for difficulty urinating.  Musculoskeletal: Positive for arthralgias. Negative for back pain and neck pain.  Skin: Negative for rash.  Neurological: Negative for syncope and headaches.     Physical Exam Updated Vital Signs BP (!) 171/89 (BP  Location: Right Arm)   Pulse 90   Temp 99.1 F (37.3 C) (Oral)   Resp 18   Ht 5\' 5"  (1.651 m)   Wt 85.7 kg   LMP 07/23/2011   SpO2 99%   BMI 31.44 kg/m   Physical Exam  Constitutional: She is oriented to person, place, and time. She appears well-developed and well-nourished. No distress.  HENT:  Head: Normocephalic and atraumatic.  Eyes: Conjunctivae and EOM are normal.  Neck: Normal range of motion.  Cardiovascular: Normal rate, regular rhythm, normal heart sounds and intact distal pulses. Exam reveals no gallop and no friction rub.  No murmur heard. Pulmonary/Chest: Effort normal and breath sounds normal. No respiratory distress. She has  no wheezes. She has no rales.  Abdominal: Soft. She exhibits no distension. There is no tenderness. There is no guarding.  Musculoskeletal: She exhibits no edema or tenderness.  Neurological: She is alert and oriented to person, place, and time.  Skin: Skin is warm and dry. No rash noted. She is not diaphoretic. No erythema.  Nursing note and vitals reviewed.    ED Treatments / Results  Labs (all labs ordered are listed, but only abnormal results are displayed) Labs Reviewed  COMPREHENSIVE METABOLIC PANEL - Abnormal; Notable for the following components:      Result Value   Potassium 3.3 (*)    Glucose, Bld 148 (*)    All other components within normal limits  CBC WITH DIFFERENTIAL/PLATELET  TROPONIN I    EKG EKG Interpretation  Date/Time:  Thursday October 14 2018 15:07:23 EST Ventricular Rate:  91 PR Interval:  142 QRS Duration: 94 QT Interval:  354 QTC Calculation: 435 R Axis:   69 Text Interpretation:  Normal sinus rhythm Cannot rule out Anterior infarct , age undetermined Abnormal ECG No significant change since last tracing Confirmed by Gareth Morgan [phone]) on 10/14/2018 3:17:41 PM Also confirmed by Gareth Morgan [phone]), editor Shon Hale [phone])  on 10/14/2018 4:19:52 PM   Radiology Dg Chest 2  View  Result Date: 10/14/2018 CLINICAL DATA:  Left arm pain. EXAM: CHEST - 2 VIEW COMPARISON:  08/13/2017. FINDINGS: Mediastinum hilar structures normal. Lungs are clear. No pleural effusion or pneumothorax. Cardiomegaly with normal pulmonary vascularity. Prominent epicardial fat pads. Degenerative change thoracic spine. No acute bony abnormality identified. IMPRESSION: Stable cardiomegaly. No pulmonary venous congestion. No focal infiltrate. Electronically Signed   By: Marcello Moores  Register   On: 10/14/2018 15:59    Procedures Procedures (including critical care time)  Medications Ordered in ED Medications - No data to display   Initial Impression / Assessment and Plan / ED Course  I have reviewed the triage vital signs and the nursing notes.  Pertinent labs & imaging results that were available during my care of the patient were reviewed by me and considered in my medical decision making (see chart for details).     57 year old female with a history of RA, family history of coronary artery disease, presents with concern for left arm pain.  EKG is unchanged in comparison to prior, no signs of pericarditis.  Chest x-ray shows stable cardiomegaly, without signs of pneumonia, pneumothorax.  She has strong pulses bilaterally, no evidence of acute arterial occlusion, low suspicion for aortic dissection by history, exam and x-ray.  No asymmetric swelling, have low suspicion for DVT.  Troponins negative x2.  She is low risk heart score, and without chest pain, appropriate for outpatient follow-up.  Possible cervical radiculopathy as etiology of shoulder and arm pain, without weakness, fever.  Possible muscular strain or cervical radiculopathy.  Recommend close PCP follow up. Patient discharged in stable condition with understanding of reasons to return.   Final Clinical Impressions(s) / ED Diagnoses   Final diagnoses:  Acute pain of left shoulder  Left arm pain    ED Discharge Orders    None        Gareth Morgan, MD 10/15/18 [phone]

## 2018-10-19 DIAGNOSIS — M9903 Segmental and somatic dysfunction of lumbar region: Secondary | ICD-10-CM | POA: Diagnosis not present

## 2018-10-19 DIAGNOSIS — M5417 Radiculopathy, lumbosacral region: Secondary | ICD-10-CM | POA: Diagnosis not present

## 2018-11-03 DIAGNOSIS — Z79899 Other long term (current) drug therapy: Secondary | ICD-10-CM | POA: Diagnosis not present

## 2018-11-03 DIAGNOSIS — M25562 Pain in left knee: Secondary | ICD-10-CM | POA: Diagnosis not present

## 2018-11-03 DIAGNOSIS — M0589 Other rheumatoid arthritis with rheumatoid factor of multiple sites: Secondary | ICD-10-CM | POA: Diagnosis not present

## 2018-11-04 IMAGING — DX DG CHEST 2V
2 series · 2 of 2 positions shown · non-contrast
Comparison: None in PACs

CLINICAL DATA: Hypoxia discovered on sleep study. No known
breathing difficulties or other chest symptoms. Nonsmoker.

EXAM:
CHEST  2 VIEW

[chest pa]
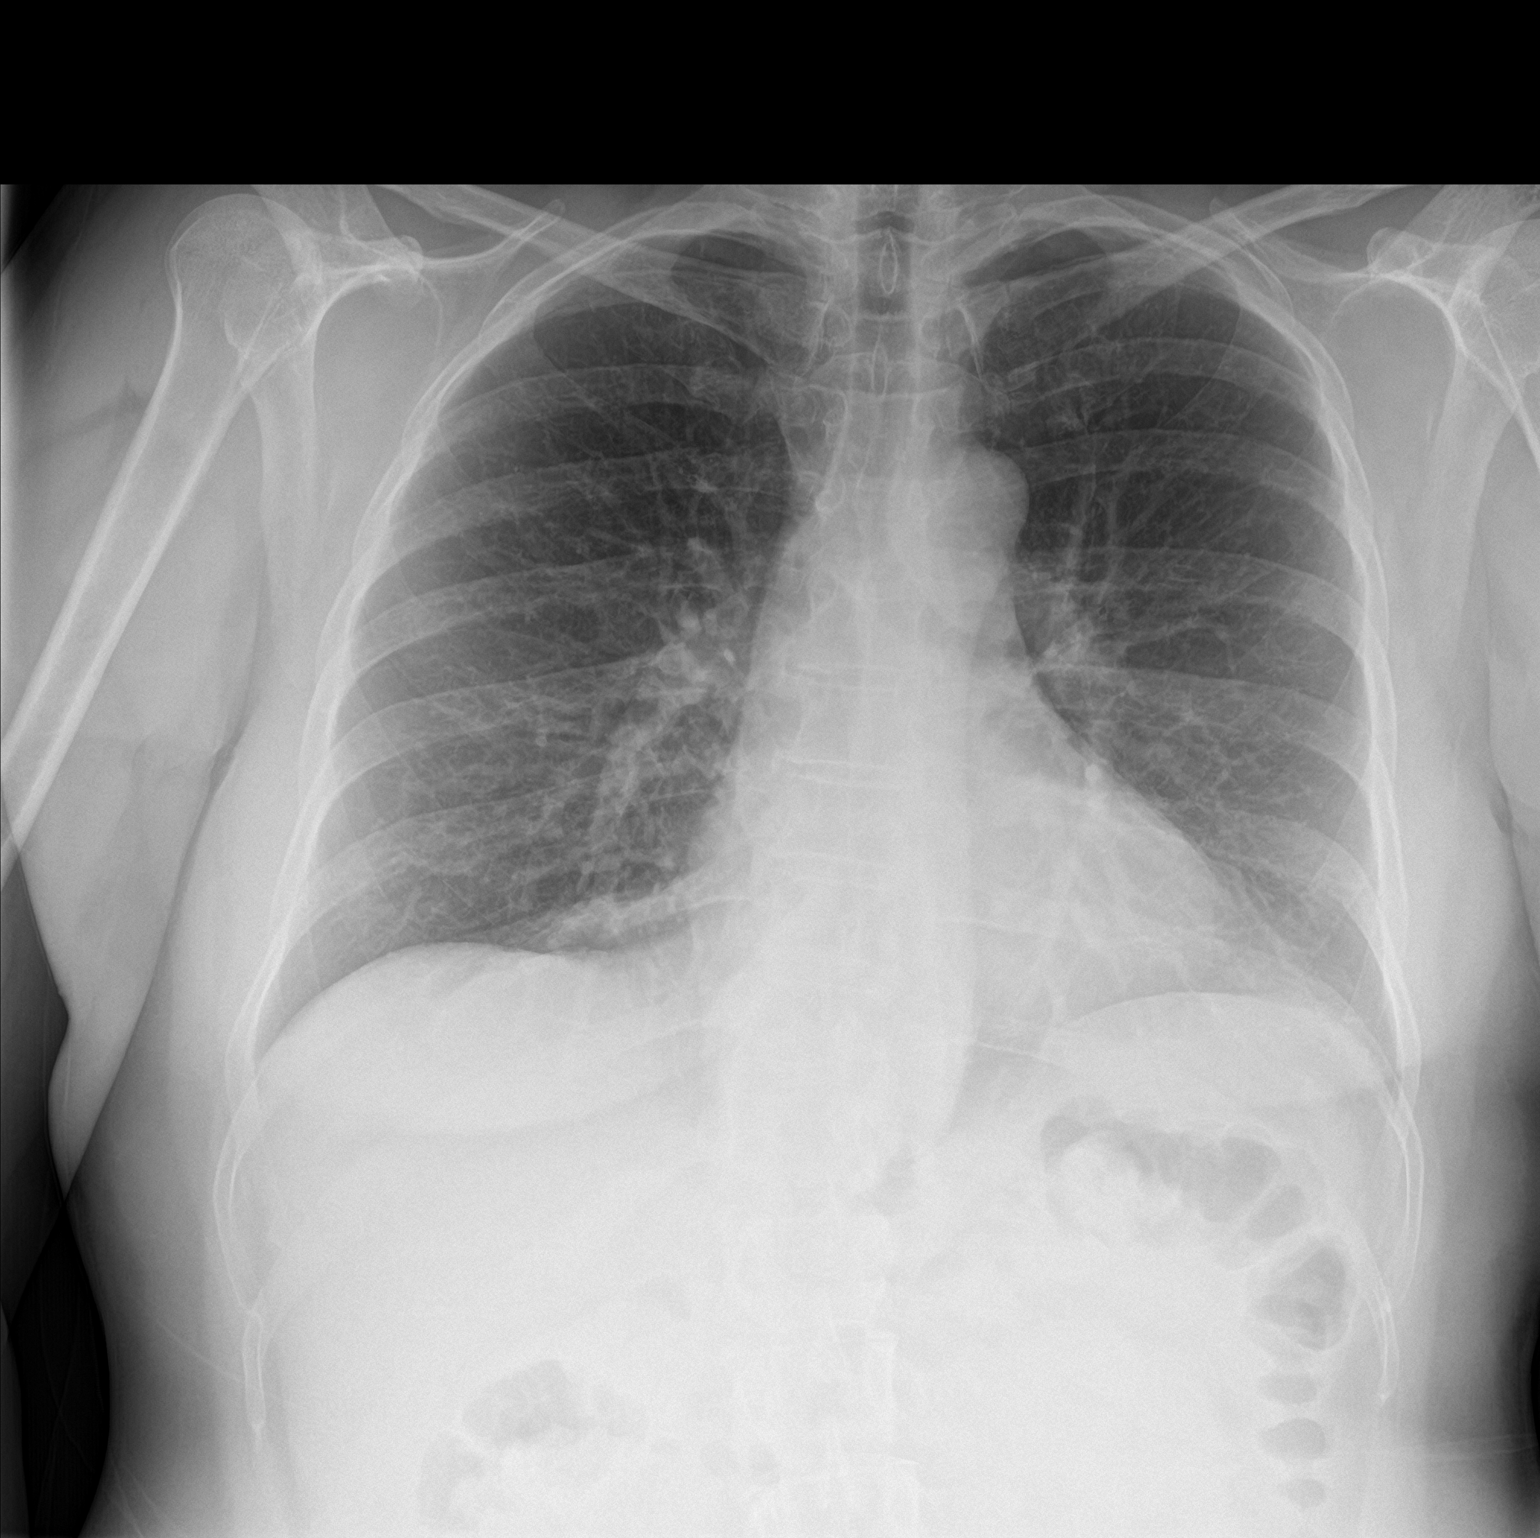

[chest lat]
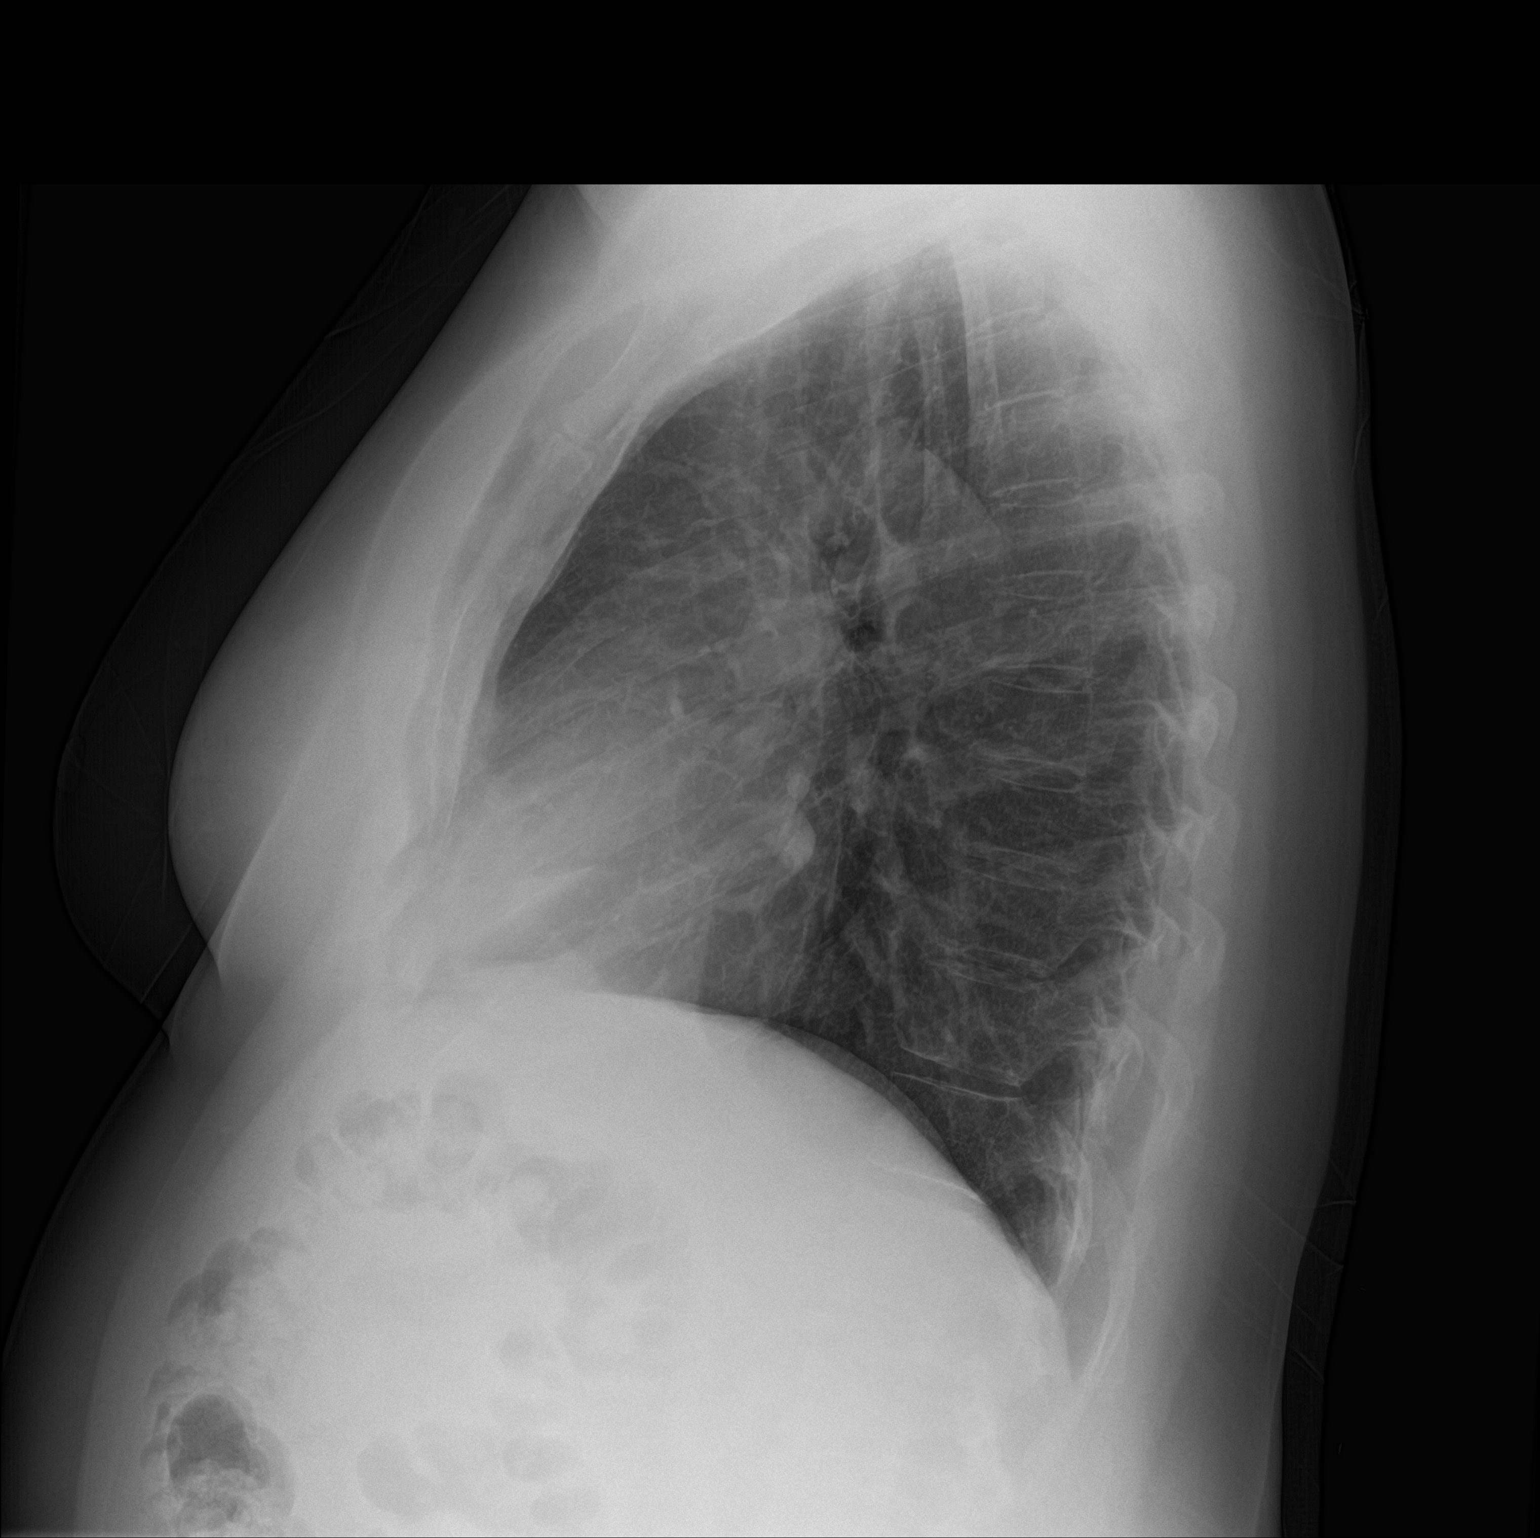

[2 of 2 positions shown; findings below may reference images not displayed]

FINDINGS: The lungs are adequately inflated. There is no focal infiltrate.
There is no pleural effusion. The heart and pulmonary vascularity
are normal. The mediastinum is normal in width. The trachea is
midline. The bony thorax is unremarkable.
IMPRESSION: There is no active cardiopulmonary disease.

## 2018-11-16 DIAGNOSIS — M5417 Radiculopathy, lumbosacral region: Secondary | ICD-10-CM | POA: Diagnosis not present

## 2018-11-16 DIAGNOSIS — M9903 Segmental and somatic dysfunction of lumbar region: Secondary | ICD-10-CM | POA: Diagnosis not present

## 2018-11-16 DIAGNOSIS — M62830 Muscle spasm of back: Secondary | ICD-10-CM | POA: Diagnosis not present

## 2018-11-16 DIAGNOSIS — M545 Low back pain: Secondary | ICD-10-CM | POA: Diagnosis not present

## 2018-11-18 DIAGNOSIS — M0589 Other rheumatoid arthritis with rheumatoid factor of multiple sites: Secondary | ICD-10-CM | POA: Diagnosis not present

## 2018-12-07 DIAGNOSIS — Z6828 Body mass index (BMI) 28.0-28.9, adult: Secondary | ICD-10-CM | POA: Diagnosis not present

## 2018-12-07 DIAGNOSIS — Z79890 Hormone replacement therapy (postmenopausal): Secondary | ICD-10-CM | POA: Diagnosis not present

## 2018-12-07 DIAGNOSIS — Z01419 Encounter for gynecological examination (general) (routine) without abnormal findings: Secondary | ICD-10-CM | POA: Diagnosis not present

## 2018-12-07 DIAGNOSIS — Z780 Asymptomatic menopausal state: Secondary | ICD-10-CM | POA: Diagnosis not present

## 2018-12-07 DIAGNOSIS — Z130 Encounter for screening for diseases of the blood and blood-forming organs and certain disorders involving the immune mechanism: Secondary | ICD-10-CM | POA: Diagnosis not present

## 2018-12-15 DIAGNOSIS — M9903 Segmental and somatic dysfunction of lumbar region: Secondary | ICD-10-CM | POA: Diagnosis not present

## 2018-12-15 DIAGNOSIS — M545 Low back pain: Secondary | ICD-10-CM | POA: Diagnosis not present

## 2018-12-15 DIAGNOSIS — M62830 Muscle spasm of back: Secondary | ICD-10-CM | POA: Diagnosis not present

## 2018-12-15 DIAGNOSIS — M5417 Radiculopathy, lumbosacral region: Secondary | ICD-10-CM | POA: Diagnosis not present

## 2019-01-12 DIAGNOSIS — M9903 Segmental and somatic dysfunction of lumbar region: Secondary | ICD-10-CM | POA: Diagnosis not present

## 2019-01-12 DIAGNOSIS — M62830 Muscle spasm of back: Secondary | ICD-10-CM | POA: Diagnosis not present

## 2019-01-12 DIAGNOSIS — M5417 Radiculopathy, lumbosacral region: Secondary | ICD-10-CM | POA: Diagnosis not present

## 2019-01-12 DIAGNOSIS — M545 Low back pain: Secondary | ICD-10-CM | POA: Diagnosis not present

## 2019-01-13 DIAGNOSIS — Z79899 Other long term (current) drug therapy: Secondary | ICD-10-CM | POA: Diagnosis not present

## 2019-01-13 DIAGNOSIS — M0589 Other rheumatoid arthritis with rheumatoid factor of multiple sites: Secondary | ICD-10-CM | POA: Diagnosis not present

## 2019-02-09 DIAGNOSIS — M9903 Segmental and somatic dysfunction of lumbar region: Secondary | ICD-10-CM | POA: Diagnosis not present

## 2019-02-09 DIAGNOSIS — M5417 Radiculopathy, lumbosacral region: Secondary | ICD-10-CM | POA: Diagnosis not present

## 2019-02-09 DIAGNOSIS — M62830 Muscle spasm of back: Secondary | ICD-10-CM | POA: Diagnosis not present

## 2019-02-09 DIAGNOSIS — M545 Low back pain: Secondary | ICD-10-CM | POA: Diagnosis not present

## 2019-03-10 DIAGNOSIS — M0589 Other rheumatoid arthritis with rheumatoid factor of multiple sites: Secondary | ICD-10-CM | POA: Diagnosis not present

## 2019-03-10 DIAGNOSIS — Z79899 Other long term (current) drug therapy: Secondary | ICD-10-CM | POA: Diagnosis not present

## 2019-04-19 DIAGNOSIS — G43019 Migraine without aura, intractable, without status migrainosus: Secondary | ICD-10-CM | POA: Diagnosis not present

## 2019-04-19 DIAGNOSIS — G43719 Chronic migraine without aura, intractable, without status migrainosus: Secondary | ICD-10-CM | POA: Diagnosis not present

## 2019-05-17 DIAGNOSIS — M0589 Other rheumatoid arthritis with rheumatoid factor of multiple sites: Secondary | ICD-10-CM | POA: Diagnosis not present

## 2019-05-17 DIAGNOSIS — Z79899 Other long term (current) drug therapy: Secondary | ICD-10-CM | POA: Diagnosis not present

## 2019-07-12 DIAGNOSIS — M0589 Other rheumatoid arthritis with rheumatoid factor of multiple sites: Secondary | ICD-10-CM | POA: Diagnosis not present

## 2019-07-13 ENCOUNTER — Other Ambulatory Visit: Payer: Self-pay | Admitting: Gynecology

## 2019-07-13 DIAGNOSIS — Z1231 Encounter for screening mammogram for malignant neoplasm of breast: Secondary | ICD-10-CM

## 2019-08-17 DIAGNOSIS — M0589 Other rheumatoid arthritis with rheumatoid factor of multiple sites: Secondary | ICD-10-CM | POA: Diagnosis not present

## 2019-08-17 DIAGNOSIS — M2550 Pain in unspecified joint: Secondary | ICD-10-CM | POA: Diagnosis not present

## 2019-08-17 DIAGNOSIS — Z79899 Other long term (current) drug therapy: Secondary | ICD-10-CM | POA: Diagnosis not present

## 2019-08-24 ENCOUNTER — Ambulatory Visit
Admission: RE | Admit: 2019-08-24 | Discharge: 2019-08-24 | Disposition: A | Discharge status: Home / Self Care | Payer: BC Managed Care – PPO | Source: Ambulatory Visit | Attending: Gynecology | Admitting: Gynecology

## 2019-08-24 ENCOUNTER — Other Ambulatory Visit: Payer: Self-pay

## 2019-08-24 DIAGNOSIS — Z1231 Encounter for screening mammogram for malignant neoplasm of breast: Secondary | ICD-10-CM

## 2019-09-06 DIAGNOSIS — M0589 Other rheumatoid arthritis with rheumatoid factor of multiple sites: Secondary | ICD-10-CM | POA: Diagnosis not present

## 2019-10-05 DIAGNOSIS — G43719 Chronic migraine without aura, intractable, without status migrainosus: Secondary | ICD-10-CM | POA: Diagnosis not present

## 2019-10-05 DIAGNOSIS — H5203 Hypermetropia, bilateral: Secondary | ICD-10-CM | POA: Diagnosis not present

## 2019-10-05 DIAGNOSIS — G43019 Migraine without aura, intractable, without status migrainosus: Secondary | ICD-10-CM | POA: Diagnosis not present

## 2019-10-05 DIAGNOSIS — R519 Headache, unspecified: Secondary | ICD-10-CM | POA: Diagnosis not present

## 2019-10-11 DIAGNOSIS — Z0000 Encounter for general adult medical examination without abnormal findings: Secondary | ICD-10-CM | POA: Diagnosis not present

## 2019-10-11 DIAGNOSIS — K219 Gastro-esophageal reflux disease without esophagitis: Secondary | ICD-10-CM | POA: Diagnosis not present

## 2019-10-11 DIAGNOSIS — E039 Hypothyroidism, unspecified: Secondary | ICD-10-CM | POA: Diagnosis not present

## 2019-11-01 DIAGNOSIS — M0589 Other rheumatoid arthritis with rheumatoid factor of multiple sites: Secondary | ICD-10-CM | POA: Diagnosis not present

## 2019-11-01 DIAGNOSIS — Z79899 Other long term (current) drug therapy: Secondary | ICD-10-CM | POA: Diagnosis not present

## 2019-11-11 ENCOUNTER — Other Ambulatory Visit: Payer: Self-pay

## 2019-11-11 DIAGNOSIS — Z20822 Contact with and (suspected) exposure to covid-19: Secondary | ICD-10-CM

## 2019-11-13 LAB — NOVEL CORONAVIRUS, NAA: SARS-CoV-2, NAA: NOT DETECTED

## 2019-11-30 DIAGNOSIS — E039 Hypothyroidism, unspecified: Secondary | ICD-10-CM | POA: Diagnosis not present

## 2019-11-30 DIAGNOSIS — Z13220 Encounter for screening for lipoid disorders: Secondary | ICD-10-CM | POA: Diagnosis not present

## 2019-12-27 DIAGNOSIS — M0589 Other rheumatoid arthritis with rheumatoid factor of multiple sites: Secondary | ICD-10-CM | POA: Diagnosis not present

## 2020-01-26 DIAGNOSIS — Z780 Asymptomatic menopausal state: Secondary | ICD-10-CM | POA: Diagnosis not present

## 2020-01-26 DIAGNOSIS — Z130 Encounter for screening for diseases of the blood and blood-forming organs and certain disorders involving the immune mechanism: Secondary | ICD-10-CM | POA: Diagnosis not present

## 2020-01-26 DIAGNOSIS — Z79890 Hormone replacement therapy (postmenopausal): Secondary | ICD-10-CM | POA: Diagnosis not present

## 2020-01-26 DIAGNOSIS — Z6828 Body mass index (BMI) 28.0-28.9, adult: Secondary | ICD-10-CM | POA: Diagnosis not present

## 2020-01-26 DIAGNOSIS — Z01419 Encounter for gynecological examination (general) (routine) without abnormal findings: Secondary | ICD-10-CM | POA: Diagnosis not present

## 2020-02-15 DIAGNOSIS — M2550 Pain in unspecified joint: Secondary | ICD-10-CM | POA: Diagnosis not present

## 2020-02-15 DIAGNOSIS — Z79899 Other long term (current) drug therapy: Secondary | ICD-10-CM | POA: Diagnosis not present

## 2020-02-15 DIAGNOSIS — M0589 Other rheumatoid arthritis with rheumatoid factor of multiple sites: Secondary | ICD-10-CM | POA: Diagnosis not present

## 2020-02-21 DIAGNOSIS — M0589 Other rheumatoid arthritis with rheumatoid factor of multiple sites: Secondary | ICD-10-CM | POA: Diagnosis not present

## 2020-02-24 ENCOUNTER — Ambulatory Visit: Payer: BC Managed Care – PPO | Attending: Internal Medicine

## 2020-02-24 DIAGNOSIS — Z23 Encounter for immunization: Secondary | ICD-10-CM

## 2020-02-24 NOTE — Progress Notes (Signed)
   Covid-19 Vaccination Clinic  Name:  Valerie Pacheco    MRN: [id number] DOB: 04/18/1961  02/24/2020  [name] was observed post Covid-19 immunization for 15 minutes without incident. She was provided with Vaccine Information Sheet and instruction to access the V-Safe system.   [name] was instructed to call 911 with any severe reactions post vaccine: Marland Kitchen Difficulty breathing  . Swelling of face and throat  . A fast heartbeat  . A bad rash all over body  . Dizziness and weakness   Immunizations Administered    Name Date Dose VIS Date Route   Pfizer COVID-19 Vaccine 02/24/2020  4:03 PM 0.3 mL 11/11/2019 Intramuscular   Manufacturer: [place]   Lot: [id number]   [place]: [id number]

## 2020-03-20 ENCOUNTER — Ambulatory Visit: Payer: BC Managed Care – PPO | Attending: Internal Medicine

## 2020-03-20 DIAGNOSIS — Z23 Encounter for immunization: Secondary | ICD-10-CM

## 2020-03-20 NOTE — Progress Notes (Signed)
   Covid-19 Vaccination Clinic  Name:  Valerie Pacheco    MRN: [id number] DOB: 1960-12-05  03/20/2020  [name] was observed post Covid-19 immunization for 15 minutes without incident. She was provided with Vaccine Information Sheet and instruction to access the V-Safe system.   [name] was instructed to call 911 with any severe reactions post vaccine: Marland Kitchen Difficulty breathing  . Swelling of face and throat  . A fast heartbeat  . A bad rash all over body  . Dizziness and weakness   Immunizations Administered    Name Date Dose VIS Date Route   Pfizer COVID-19 Vaccine 03/20/2020  3:49 PM 0.3 mL 01/25/2019 Intramuscular   Manufacturer: [place]   Lot: [id number]   [place]: [id number]

## 2020-04-04 DIAGNOSIS — G43719 Chronic migraine without aura, intractable, without status migrainosus: Secondary | ICD-10-CM | POA: Diagnosis not present

## 2020-04-04 DIAGNOSIS — G43019 Migraine without aura, intractable, without status migrainosus: Secondary | ICD-10-CM | POA: Diagnosis not present

## 2020-04-17 DIAGNOSIS — M0589 Other rheumatoid arthritis with rheumatoid factor of multiple sites: Secondary | ICD-10-CM | POA: Diagnosis not present

## 2020-04-17 DIAGNOSIS — Z79899 Other long term (current) drug therapy: Secondary | ICD-10-CM | POA: Diagnosis not present

## 2020-05-10 ENCOUNTER — Other Ambulatory Visit (HOSPITAL_BASED_OUTPATIENT_CLINIC_OR_DEPARTMENT_OTHER): Payer: Self-pay | Admitting: Family Medicine

## 2020-05-10 ENCOUNTER — Other Ambulatory Visit: Payer: Self-pay

## 2020-05-10 ENCOUNTER — Encounter (HOSPITAL_BASED_OUTPATIENT_CLINIC_OR_DEPARTMENT_OTHER): Payer: Self-pay

## 2020-05-10 ENCOUNTER — Ambulatory Visit (HOSPITAL_BASED_OUTPATIENT_CLINIC_OR_DEPARTMENT_OTHER)
Admission: RE | Admit: 2020-05-10 | Discharge: 2020-05-10 | Disposition: A | Discharge status: Home / Self Care | Payer: BC Managed Care – PPO | Source: Ambulatory Visit | Attending: Family Medicine | Admitting: Family Medicine

## 2020-05-10 DIAGNOSIS — R3915 Urgency of urination: Secondary | ICD-10-CM | POA: Diagnosis not present

## 2020-05-10 DIAGNOSIS — R1084 Generalized abdominal pain: Secondary | ICD-10-CM

## 2020-05-10 DIAGNOSIS — N1330 Unspecified hydronephrosis: Secondary | ICD-10-CM | POA: Diagnosis not present

## 2020-05-10 DIAGNOSIS — R109 Unspecified abdominal pain: Secondary | ICD-10-CM | POA: Diagnosis not present

## 2020-05-10 DIAGNOSIS — N200 Calculus of kidney: Secondary | ICD-10-CM | POA: Diagnosis not present

## 2020-05-10 DIAGNOSIS — N201 Calculus of ureter: Secondary | ICD-10-CM | POA: Diagnosis not present

## 2020-05-10 MED ORDER — IOHEXOL 300 MG/ML  SOLN
100.0000 mL | Freq: Once | INTRAMUSCULAR | Status: AC | PRN
  Administered 2020-05-10: 100 mL via INTRAVENOUS

## 2020-05-14 ENCOUNTER — Emergency Department (HOSPITAL_BASED_OUTPATIENT_CLINIC_OR_DEPARTMENT_OTHER)
Admission: EM | Admit: 2020-05-14 | Discharge: 2020-05-15 | Disposition: A | Discharge status: Home / Self Care | Payer: BC Managed Care – PPO | Attending: Emergency Medicine | Admitting: Emergency Medicine

## 2020-05-14 ENCOUNTER — Other Ambulatory Visit: Payer: Self-pay

## 2020-05-14 ENCOUNTER — Encounter (HOSPITAL_BASED_OUTPATIENT_CLINIC_OR_DEPARTMENT_OTHER): Payer: Self-pay

## 2020-05-14 DIAGNOSIS — N200 Calculus of kidney: Secondary | ICD-10-CM | POA: Diagnosis not present

## 2020-05-14 DIAGNOSIS — E039 Hypothyroidism, unspecified: Secondary | ICD-10-CM | POA: Diagnosis not present

## 2020-05-14 DIAGNOSIS — Z79899 Other long term (current) drug therapy: Secondary | ICD-10-CM | POA: Diagnosis not present

## 2020-05-14 DIAGNOSIS — N390 Urinary tract infection, site not specified: Secondary | ICD-10-CM | POA: Insufficient documentation

## 2020-05-14 DIAGNOSIS — Z79810 Long term (current) use of selective estrogen receptor modulators (SERMs): Secondary | ICD-10-CM | POA: Insufficient documentation

## 2020-05-14 DIAGNOSIS — R509 Fever, unspecified: Secondary | ICD-10-CM | POA: Insufficient documentation

## 2020-05-14 DIAGNOSIS — I10 Essential (primary) hypertension: Secondary | ICD-10-CM | POA: Diagnosis not present

## 2020-05-14 DIAGNOSIS — B9689 Other specified bacterial agents as the cause of diseases classified elsewhere: Secondary | ICD-10-CM | POA: Diagnosis not present

## 2020-05-14 DIAGNOSIS — Z791 Long term (current) use of non-steroidal anti-inflammatories (NSAID): Secondary | ICD-10-CM | POA: Diagnosis not present

## 2020-05-14 DIAGNOSIS — R319 Hematuria, unspecified: Secondary | ICD-10-CM | POA: Diagnosis not present

## 2020-05-14 DIAGNOSIS — R109 Unspecified abdominal pain: Secondary | ICD-10-CM | POA: Diagnosis not present

## 2020-05-14 DIAGNOSIS — R110 Nausea: Secondary | ICD-10-CM | POA: Diagnosis not present

## 2020-05-14 LAB — CBC
HCT: 41.2 % (ref 36.0–46.0)
Hemoglobin: 13.7 g/dL (ref 12.0–15.0)
MCH: 31.2 pg (ref 26.0–34.0)
MCHC: 33.3 g/dL (ref 30.0–36.0)
MCV: 93.8 fL (ref 80.0–100.0)
Platelets: 209 10*3/uL (ref 150–400)
RBC: 4.39 MIL/uL (ref 3.87–5.11)
RDW: 13.3 % (ref 11.5–15.5)
WBC: 6.7 10*3/uL (ref 4.0–10.5)
nRBC: 0 % (ref 0.0–0.2)

## 2020-05-14 LAB — BASIC METABOLIC PANEL
Anion gap: 12 (ref 5–15)
BUN: 22 mg/dL — ABNORMAL HIGH (ref 6–20)
CO2: 25 mmol/L (ref 22–32)
Calcium: 8.9 mg/dL (ref 8.9–10.3)
Chloride: 104 mmol/L (ref 98–111)
Creatinine, Ser: 1.05 mg/dL — ABNORMAL HIGH (ref 0.44–1.00)
GFR calc Af Amer: >60 mL/min (ref >60)
GFR calc non Af Amer: 59 mL/min — ABNORMAL LOW (ref >60)
Glucose, Bld: 117 mg/dL — ABNORMAL HIGH (ref 70–99)
Potassium: 4 mmol/L (ref 3.5–5.1)
Sodium: 141 mmol/L (ref 135–145)

## 2020-05-14 LAB — URINALYSIS, ROUTINE W REFLEX MICROSCOPIC
Bilirubin Urine: NEGATIVE
Glucose, UA: NEGATIVE mg/dL
Ketones, ur: NEGATIVE mg/dL
Nitrite: NEGATIVE
Protein, ur: 30 mg/dL — AB
Specific Gravity, Urine: >1.03 — ABNORMAL HIGH (ref 1.005–1.030)
pH: 5.5 (ref 5.0–8.0)

## 2020-05-14 LAB — URINALYSIS, MICROSCOPIC (REFLEX): RBC / HPF: >50 RBC/hpf (ref 0–5)

## 2020-05-14 MED ORDER — KETOROLAC TROMETHAMINE 30 MG/ML IJ SOLN
15.0000 mg | Freq: Once | INTRAMUSCULAR | Status: AC
  Administered 2020-05-14: 15 mg via INTRAVENOUS
  Filled 2020-05-14: qty 1

## 2020-05-14 MED ORDER — SODIUM CHLORIDE 0.9 % IV SOLN
1.0000 g | Freq: Once | INTRAVENOUS | Status: AC
  Administered 2020-05-14: 1 g via INTRAVENOUS
  Filled 2020-05-14: qty 10

## 2020-05-14 MED ORDER — ONDANSETRON HCL 4 MG/2ML IJ SOLN
4.0000 mg | Freq: Once | INTRAMUSCULAR | Status: AC
  Administered 2020-05-14: 4 mg via INTRAVENOUS
  Filled 2020-05-14: qty 2

## 2020-05-14 MED ORDER — CEPHALEXIN 500 MG PO CAPS: 500.0000 mg | ORAL_CAPSULE | Freq: Four times a day (QID) | ORAL | 0 refills | Status: DC

## 2020-05-14 MED ORDER — HYDROMORPHONE HCL 1 MG/ML IJ SOLN
0.5000 mg | Freq: Once | INTRAMUSCULAR | Status: AC
  Administered 2020-05-14: 0.5 mg via INTRAVENOUS
  Filled 2020-05-14: qty 1

## 2020-05-14 MED ORDER — ONDANSETRON 4 MG PO TBDP: 4.0000 mg | ORAL_TABLET | Freq: Three times a day (TID) | ORAL | 0 refills | Status: AC | PRN

## 2020-05-14 MED ORDER — HYDROCODONE-ACETAMINOPHEN 5-325 MG PO TABS: 1.0000 | ORAL_TABLET | ORAL | 0 refills | Status: AC | PRN

## 2020-05-14 NOTE — ED Provider Notes (Signed)
[place] EMERGENCY DEPARTMENT Provider Note   CSN: [id number] Arrival date & time: 05/14/20  2118     History Chief Complaint  Patient presents with  . Nephrolithiasis    Valerie Pacheco is a 59 y.o. female.  HPI Patient presents with kidney stone pain.  Has had pain over the last week.  Did have a fever initially but states she has had some chills since.  4 days ago she had a CT scan done by her PCP that showed a 4 mm mid to distal ureteral stone on the right.  States the pain started in the right flank and then went to abdomen and went back to the flank.  No vomiting but has had some nausea.  Was only given Flomax and no pain medicine by her doctor.  Has had previous kidney stones.  Last one however was years ago.  Due to chronic renal    Past Medical History:  Diagnosis Date  . Arthritis   . Chronic kidney disease    multiple kidney stones  . GERD (gastroesophageal reflux disease)   . Headache(784.0)   . Heart murmur    was told in the 1990's, but not since  . History of kidney stones   . Hypertension    when pregnacy  . Hypothyroidism   . Migraines   . Rheumatoid arthritis ([place])   . Seasonal allergies   . Sinus disorder   . Thyroid condition     Patient Active Problem List   Diagnosis Date Noted  . Nocturnal hypoxia 08/13/2017  . Nasal turbinate hypertrophy 05/22/2017  . Snoring 02/12/2017  . Fatigue due to sleep pattern disturbance 02/12/2017  . Excessive daytime sleepiness 02/12/2017  . Chronic seasonal allergic rhinitis due to pollen 02/12/2017    Past Surgical History:  Procedure Laterality Date  .  ureatha sugery    . BLADDER REPAIR W/ CESAREAN SECTION     c section times 2  . COLONOSCOPY    . LITHOTRIPSY     x2  . NASAL TURBINATE REDUCTION    . ORIF FINGER FRACTURE  01/23/2012   Procedure: OPEN REDUCTION INTERNAL FIXATION (ORIF) METACARPAL (FINGER) FRACTURE;  Surgeon: Cammie Sickle., MD;  Location: [place];   Service: Orthopedics;  Laterality: Left;  left small proximal interphalangeal fracture  . RHINOPLASTY    . SINUS ENDO WITH FUSION Bilateral 05/22/2017   Procedure: BILATERAL ENDOSCOPIC RESECTION OF CONCHA BULLOSA WITH FUSION SCAN;  Surgeon: Jerrell Belfast, MD;  Location: [place];  Service: ENT;  Laterality: Bilateral;  . SINUS EXPLORATION    . TURBINATE REDUCTION Bilateral 05/22/2017   Procedure: BILATERAL INFERIOR TURBINATE REDUCTION;  Surgeon: Jerrell Belfast, MD;  Location: [place];  Service: ENT;  Laterality: Bilateral;     OB History   No obstetric history on file.     Family History  Problem Relation Age of Onset  . Diabetes Father   . Hypertension Father   . Heart disease Father   . Breast cancer Neg Hx     Social History   Tobacco Use  . Smoking status: Never Smoker  . Smokeless tobacco: Never Used  Vaping Use  . Vaping Use: Never used  Substance Use Topics  . Alcohol use: Not Currently    Comment: occ  . Drug use: No    Home Medications Prior to Admission medications   Medication Sig Start Date End Date Taking? Authorizing Provider  cephALEXin (KEFLEX) 500 MG capsule Take 1 capsule (  500 mg total) by mouth 4 (four) times daily. 05/14/20   Davonna Belling, MD  cetirizine (ZYRTEC) 10 MG tablet Take 10 mg by mouth at bedtime.     [provider]  Cholecalciferol (VITAMIN D3) 2000 units TABS Take 2,000 Units by mouth at bedtime.    [provider]  estradiol (VIVELLE-DOT) 0.075 MG/24HR Place 0.5 patches onto the skin 2 (two) times a week. Wednesday & Sunday 04/05/17   [provider]  Ferrous Sulfate Dried 200 (65 Fe) MG TABS Take 200 mg by mouth at bedtime.    [provider]  folic acid (FOLVITE) 1 MG tablet Take 2 mg by mouth at bedtime.  01/28/17   [provider]  Golimumab ([place] ARIA IV) Inject 1 Dose into the vein every 8 (eight) weeks.    [provider]  HYDROcodone-acetaminophen (NORCO/VICODIN) 5-325 MG  tablet Take 1-2 tablets by mouth every 4 (four) hours as needed. 05/14/20   Davonna Belling, MD  ibuprofen (ADVIL,MOTRIN) 200 MG tablet Take 400 mg by mouth every 8 (eight) hours as needed (for headaches.).    [provider]  levothyroxine (SYNTHROID, LEVOTHROID) 100 MCG tablet Take 100 mcg by mouth daily before breakfast.  11/16/16   [provider]  Magnesium 250 MG TABS Take 500 mg by mouth at bedtime.     [provider]  methotrexate (RHEUMATREX) 2.5 MG tablet Take 20 mg by mouth every Tuesday at 6 PM. Taking 8 tabs every Tuesday 02/06/17   [provider]  naproxen sodium (ANAPROX) 220 MG tablet Take 220 mg by mouth every 8 (eight) hours as needed (for headaches.).    [provider]  omeprazole (PRILOSEC) 20 MG capsule Take 20 mg by mouth at bedtime.     [provider]  ondansetron (ZOFRAN-ODT) 4 MG disintegrating tablet Take 1 tablet (4 mg total) by mouth every 8 (eight) hours as needed for nausea or vomiting. 05/14/20   Davonna Belling, MD  progesterone (PROMETRIUM) 200 MG capsule Take 200 mg by mouth See admin instructions. TAKE 1 CAPSULE (200 MG) BY MOUTH DAILY FOR 14 DAYS EVERY OTHER MONTH 02/06/17   [provider]  verapamil (VERELAN PM) 120 MG 24 hr capsule Take 120 mg by mouth at bedtime.  01/20/17   [provider]    Allergies    No known allergies  Review of Systems   Review of Systems  Constitutional: Positive for fever.  HENT: Negative for congestion.   Respiratory: Negative for shortness of breath.   Cardiovascular: Negative for chest pain.  Gastrointestinal: Positive for abdominal pain and nausea.  Genitourinary: Positive for flank pain.  Musculoskeletal: Negative for back pain.  Neurological: Negative for weakness.  Psychiatric/Behavioral: Negative for confusion.    Physical Exam Updated Vital Signs BP (!) 163/81   Pulse 65   Temp 98.3 F (36.8 C) (Oral)   Resp 17   Ht 5' 5.5" (1.664 m)    Wt 70.8 kg   LMP 07/23/2011   SpO2 98%   BMI 25.56 kg/m   Physical Exam Constitutional:      Appearance: Normal appearance.  HENT:     Head: Normocephalic.  Eyes:     Pupils: Pupils are equal, round, and reactive to light.  Cardiovascular:     Rate and Rhythm: Regular rhythm.  Pulmonary:     Breath sounds: No wheezing or rhonchi.  Abdominal:     Tenderness: There is abdominal tenderness.     Comments: Mild right lower  quadrant tenderness without rebound or guarding.  Genitourinary:    Comments: Mild CVA tenderness on right. Skin:    General: Skin is warm.     Capillary Refill: Capillary refill takes less than 2 seconds.  Neurological:     Mental Status: She is alert and oriented to person, place, and time.     ED Results / Procedures / Treatments   Labs (all labs ordered are listed, but only abnormal results are displayed) Labs Reviewed  BASIC METABOLIC PANEL - Abnormal; Notable for the following components:      Result Value   Glucose, Bld 117 (*)    BUN 22 (*)    Creatinine, Ser 1.05 (*)    GFR calc non Af Amer 59 (*)    All other components within normal limits  URINALYSIS, ROUTINE W REFLEX MICROSCOPIC - Abnormal; Notable for the following components:   APPearance CLOUDY (*)    Specific Gravity, Urine >1.030 (*)    Hgb urine dipstick LARGE (*)    Protein, ur 30 (*)    Leukocytes,Ua TRACE (*)    All other components within normal limits  URINALYSIS, MICROSCOPIC (REFLEX) - Abnormal; Notable for the following components:   Bacteria, UA MANY (*)    All other components within normal limits  URINE CULTURE  CBC    EKG None  Radiology No results found.  Procedures Procedures (including critical care time)  Medications Ordered in ED Medications  cefTRIAXone (ROCEPHIN) 1 g in sodium chloride 0.9 % 100 mL IVPB (1 g Intravenous New Bag/Given 05/14/20 2347)  ketorolac (TORADOL) 30 MG/ML injection 15 mg (15 mg Intravenous Given 05/14/20 2225)  HYDROmorphone  (DILAUDID) injection 0.5 mg (0.5 mg Intravenous Given 05/14/20 2223)  ondansetron (ZOFRAN) injection 4 mg (4 mg Intravenous Given 05/14/20 2226)    ED Course  I have reviewed the triage vital signs and the nursing notes.  Pertinent labs & imaging results that were available during my care of the patient were reviewed by me and considered in my medical decision making (see chart for details).    MDM Rules/Calculators/A&P                          Patient with flank pain.  Known 4 mm right-sided ureteral stone.  Had not been given pain meds.  Feels better after treatment now but urine showed possible infection.  Does have many bacteria and 6 11-20 white cells.  No systemic white count or fevers.  However she is on methotrexate.  Well-appearing.  Discussed with Dr. Claudia Desanctis from urology.  Will follow up within 1 week.  Will give ceftriaxone here and Keflex for home.  Culture sent.  If worsening symptoms will follow up sooner either here or at [facility].  Will discharge home after Rocephin Final Clinical Impression(s) / ED Diagnoses Final diagnoses:  Kidney stone  Urinary tract infection with hematuria, site unspecified    Rx / DC Orders ED Discharge Orders         Ordered    cephALEXin (KEFLEX) 500 MG capsule  4 times daily     Discontinue  Reprint     05/14/20 2340    HYDROcodone-acetaminophen (NORCO/VICODIN) 5-325 MG tablet  Every 4 hours PRN     Discontinue  Reprint     05/14/20 2340    ondansetron (ZOFRAN-ODT) 4 MG disintegrating tablet  Every 8 hours PRN     Discontinue  Reprint     05/14/20 2340  Davonna Belling, MD 05/15/20 0000

## 2020-05-14 NOTE — ED Triage Notes (Signed)
Pt had a CT scan on 6/10 and was dx with a kidney stone. Today pt is having severe pain in her RLQ abd.

## 2020-05-15 MED ORDER — CEPHALEXIN 500 MG PO CAPS: 500.0000 mg | ORAL_CAPSULE | Freq: Four times a day (QID) | ORAL | 0 refills | Status: AC

## 2020-05-16 LAB — URINE CULTURE: Culture: 40000 — AB

## 2020-05-17 ENCOUNTER — Telehealth: Payer: Self-pay | Admitting: *Deleted

## 2020-05-17 NOTE — Telephone Encounter (Signed)
Post ED Visit - Positive Culture Follow-up  Culture report reviewed by antimicrobial stewardship pharmacist: [place] Team []  Elenor Quinones, Pharm.D. []  Heide Guile, Pharm.D., BCPS AQ-ID []  Parks Neptune, Pharm.D., BCPS []  Alycia Rossetti, Pharm.D., BCPS []  [place], Pharm.D., BCPS, AAHIVP []  Legrand Como, Pharm.D., BCPS, AAHIVP []  Salome Arnt, PharmD, BCPS []  Johnnette Gourd, PharmD, BCPS []  Hughes Better, PharmD, BCPS []  Leeroy Cha, PharmD []  Laqueta Linden, PharmD, BCPS []  Albertina Parr, PharmD  [place] Team []  Leodis Sias, PharmD []  Lindell Spar, PharmD []  Royetta Asal, PharmD [x]  Graylin Shiver, Rph []  Rema Fendt) Glennon Mac, PharmD []  Arlyn Dunning, PharmD []  Netta Cedars, PharmD []  Dia Sitter, PharmD []  Leone Haven, PharmD []  Gretta Arab, PharmD []  Theodis Shove, PharmD []  Peggyann Juba, PharmD []  Reuel Boom, PharmD   Positive urine culture Treated with Cephalexin, organism sensitive to the same and no further patient follow-up is required at this time.  Harlon Flor [facility] 05/17/2020, 12:42 PM

## 2020-05-21 DIAGNOSIS — N200 Calculus of kidney: Secondary | ICD-10-CM | POA: Diagnosis not present

## 2020-05-21 DIAGNOSIS — N201 Calculus of ureter: Secondary | ICD-10-CM | POA: Diagnosis not present

## 2020-05-21 DIAGNOSIS — R109 Unspecified abdominal pain: Secondary | ICD-10-CM | POA: Diagnosis not present

## 2020-05-22 DIAGNOSIS — N201 Calculus of ureter: Secondary | ICD-10-CM | POA: Diagnosis not present

## 2020-05-22 DIAGNOSIS — I493 Ventricular premature depolarization: Secondary | ICD-10-CM | POA: Diagnosis not present

## 2020-05-22 DIAGNOSIS — R9431 Abnormal electrocardiogram [ECG] [EKG]: Secondary | ICD-10-CM | POA: Diagnosis not present

## 2020-05-22 DIAGNOSIS — N132 Hydronephrosis with renal and ureteral calculous obstruction: Secondary | ICD-10-CM | POA: Diagnosis not present

## 2020-06-01 DIAGNOSIS — N201 Calculus of ureter: Secondary | ICD-10-CM | POA: Diagnosis not present

## 2020-06-01 DIAGNOSIS — N200 Calculus of kidney: Secondary | ICD-10-CM | POA: Diagnosis not present

## 2020-06-05 ENCOUNTER — Emergency Department (HOSPITAL_BASED_OUTPATIENT_CLINIC_OR_DEPARTMENT_OTHER)
Admission: EM | Admit: 2020-06-05 | Discharge: 2020-06-05 | Disposition: A | Discharge status: Home / Self Care | Payer: BC Managed Care – PPO | Attending: Emergency Medicine | Admitting: Emergency Medicine

## 2020-06-05 ENCOUNTER — Emergency Department (HOSPITAL_BASED_OUTPATIENT_CLINIC_OR_DEPARTMENT_OTHER): Payer: BC Managed Care – PPO

## 2020-06-05 ENCOUNTER — Encounter (HOSPITAL_BASED_OUTPATIENT_CLINIC_OR_DEPARTMENT_OTHER): Payer: Self-pay

## 2020-06-05 ENCOUNTER — Other Ambulatory Visit: Payer: Self-pay

## 2020-06-05 DIAGNOSIS — N189 Chronic kidney disease, unspecified: Secondary | ICD-10-CM | POA: Diagnosis not present

## 2020-06-05 DIAGNOSIS — R509 Fever, unspecified: Secondary | ICD-10-CM | POA: Diagnosis not present

## 2020-06-05 DIAGNOSIS — Z79899 Other long term (current) drug therapy: Secondary | ICD-10-CM | POA: Diagnosis not present

## 2020-06-05 DIAGNOSIS — E039 Hypothyroidism, unspecified: Secondary | ICD-10-CM | POA: Insufficient documentation

## 2020-06-05 DIAGNOSIS — I129 Hypertensive chronic kidney disease with stage 1 through stage 4 chronic kidney disease, or unspecified chronic kidney disease: Secondary | ICD-10-CM | POA: Insufficient documentation

## 2020-06-05 DIAGNOSIS — I700 Atherosclerosis of aorta: Secondary | ICD-10-CM | POA: Diagnosis not present

## 2020-06-05 DIAGNOSIS — N2889 Other specified disorders of kidney and ureter: Secondary | ICD-10-CM | POA: Diagnosis not present

## 2020-06-05 DIAGNOSIS — K449 Diaphragmatic hernia without obstruction or gangrene: Secondary | ICD-10-CM | POA: Diagnosis not present

## 2020-06-05 DIAGNOSIS — N132 Hydronephrosis with renal and ureteral calculous obstruction: Secondary | ICD-10-CM | POA: Diagnosis not present

## 2020-06-05 DIAGNOSIS — N201 Calculus of ureter: Secondary | ICD-10-CM | POA: Diagnosis not present

## 2020-06-05 LAB — CBC WITH DIFFERENTIAL/PLATELET
Abs Immature Granulocytes: 0.02 10*3/uL (ref 0.00–0.07)
Basophils Absolute: 0 10*3/uL (ref 0.0–0.1)
Basophils Relative: 0 %
Eosinophils Absolute: 0 10*3/uL (ref 0.0–0.5)
Eosinophils Relative: 0 %
HCT: 34.2 % — ABNORMAL LOW (ref 36.0–46.0)
Hemoglobin: 11.4 g/dL — ABNORMAL LOW (ref 12.0–15.0)
Immature Granulocytes: 0 %
Lymphocytes Relative: 12 %
Lymphs Abs: 1.1 10*3/uL (ref 0.7–4.0)
MCH: 31 pg (ref 26.0–34.0)
MCHC: 33.3 g/dL (ref 30.0–36.0)
MCV: 92.9 fL (ref 80.0–100.0)
Monocytes Absolute: 0.8 10*3/uL (ref 0.1–1.0)
Monocytes Relative: 9 %
Neutro Abs: 6.9 10*3/uL (ref 1.7–7.7)
Neutrophils Relative %: 79 %
Platelets: 211 10*3/uL (ref 150–400)
RBC: 3.68 MIL/uL — ABNORMAL LOW (ref 3.87–5.11)
RDW: 13.6 % (ref 11.5–15.5)
WBC: 8.7 10*3/uL (ref 4.0–10.5)
nRBC: 0 % (ref 0.0–0.2)

## 2020-06-05 LAB — COMPREHENSIVE METABOLIC PANEL
ALT: 13 U/L (ref 0–44)
AST: 14 U/L — ABNORMAL LOW (ref 15–41)
Albumin: 3.7 g/dL (ref 3.5–5.0)
Alkaline Phosphatase: 62 U/L (ref 38–126)
Anion gap: 10 (ref 5–15)
BUN: 15 mg/dL (ref 6–20)
CO2: 24 mmol/L (ref 22–32)
Calcium: 8.6 mg/dL — ABNORMAL LOW (ref 8.9–10.3)
Chloride: 102 mmol/L (ref 98–111)
Creatinine, Ser: 0.84 mg/dL (ref 0.44–1.00)
GFR calc Af Amer: >60 mL/min (ref >60)
GFR calc non Af Amer: >60 mL/min (ref >60)
Glucose, Bld: 133 mg/dL — ABNORMAL HIGH (ref 70–99)
Potassium: 3.7 mmol/L (ref 3.5–5.1)
Sodium: 136 mmol/L (ref 135–145)
Total Bilirubin: 0.6 mg/dL (ref 0.3–1.2)
Total Protein: 7 g/dL (ref 6.5–8.1)

## 2020-06-05 LAB — URINALYSIS, ROUTINE W REFLEX MICROSCOPIC
Bilirubin Urine: NEGATIVE
Glucose, UA: NEGATIVE mg/dL
Ketones, ur: 15 mg/dL — AB
Nitrite: NEGATIVE
Protein, ur: 30 mg/dL — AB
Specific Gravity, Urine: 1.02 (ref 1.005–1.030)
pH: 7.5 (ref 5.0–8.0)

## 2020-06-05 LAB — URINALYSIS, MICROSCOPIC (REFLEX)

## 2020-06-05 LAB — LACTIC ACID, PLASMA
Lactic Acid, Venous: 0.6 mmol/L (ref 0.5–1.9)
Lactic Acid, Venous: 0.5 mmol/L (ref 0.5–1.9)

## 2020-06-05 MED ORDER — CEPHALEXIN 500 MG PO CAPS
500.0000 mg | ORAL_CAPSULE | Freq: Four times a day (QID) | ORAL | 0 refills | Status: AC
Start: 2020-06-05 — End: ?

## 2020-06-05 MED ORDER — ACETAMINOPHEN 325 MG PO TABS
650.0000 mg | ORAL_TABLET | Freq: Once | ORAL | Status: AC
  Administered 2020-06-05: 650 mg via ORAL
  Filled 2020-06-05: qty 2

## 2020-06-05 MED ORDER — SODIUM CHLORIDE 0.9% FLUSH
3.0000 mL | Freq: Once | INTRAVENOUS | Status: DC
  Filled 2020-06-05: qty 3

## 2020-06-05 MED ORDER — SODIUM CHLORIDE 0.9 % IV SOLN
1.0000 g | Freq: Once | INTRAVENOUS | Status: AC
  Administered 2020-06-05: 1 g via INTRAVENOUS
  Filled 2020-06-05: qty 10

## 2020-06-05 NOTE — ED Notes (Signed)
Pt to CT

## 2020-06-05 NOTE — ED Triage Notes (Addendum)
Pt c/o fever, chills, HA started yesterday-denies cough/flu sx-states she recently had kidney stone with surgery with surgery-with 2nd surgery scheduled in 2 days

## 2020-06-05 NOTE — ED Provider Notes (Signed)
[place] EMERGENCY DEPARTMENT Provider Note   CSN: [id number] Arrival date & time: 06/05/20  1912     History Chief Complaint  Patient presents with  . Fever    Valerie Pacheco is a 59 y.o. female.  Patient presenting with a complaint of fever and chills.  Started last evening.  Patient developed an anterior forehead had a headache today.  But she feels that is due to not eating.  Patient is followed by [facility] urology.  2 weeks ago had right-sided kidney stone removed.  But patient thinks are still 1 there.  Scheduled for lithotripsy of left-sided kidney cyst stones in the next few days.  Patient denies any pain.  Patient had been on antibiotic during those procedures Cephadyn.  But completed that on June 29.  No real flank pain.  No real back pain.  No nausea or vomiting.  No upper respiratory symptoms.        Past Medical History:  Diagnosis Date  . Arthritis   . Chronic kidney disease    multiple kidney stones  . GERD (gastroesophageal reflux disease)   . Headache(784.0)   . Heart murmur    was told in the 1990's, but not since  . History of kidney stones   . Hypertension    when pregnacy  . Hypothyroidism   . Migraines   . Rheumatoid arthritis ([place])   . Seasonal allergies   . Sinus disorder   . Thyroid condition     Patient Active Problem List   Diagnosis Date Noted  . Nocturnal hypoxia 08/13/2017  . Nasal turbinate hypertrophy 05/22/2017  . Snoring 02/12/2017  . Fatigue due to sleep pattern disturbance 02/12/2017  . Excessive daytime sleepiness 02/12/2017  . Chronic seasonal allergic rhinitis due to pollen 02/12/2017    Past Surgical History:  Procedure Laterality Date  .  ureatha sugery    . BLADDER REPAIR W/ CESAREAN SECTION     c section times 2  . COLONOSCOPY    . LITHOTRIPSY     x2  . NASAL TURBINATE REDUCTION    . ORIF FINGER FRACTURE  01/23/2012   Procedure: OPEN REDUCTION INTERNAL FIXATION (ORIF) METACARPAL (FINGER)  FRACTURE;  Surgeon: Cammie Sickle., MD;  Location: [place];  Service: Orthopedics;  Laterality: Left;  left small proximal interphalangeal fracture  . RHINOPLASTY    . SINUS ENDO WITH FUSION Bilateral 05/22/2017   Procedure: BILATERAL ENDOSCOPIC RESECTION OF CONCHA BULLOSA WITH FUSION SCAN;  Surgeon: Jerrell Belfast, MD;  Location: [place];  Service: ENT;  Laterality: Bilateral;  . SINUS EXPLORATION    . TURBINATE REDUCTION Bilateral 05/22/2017   Procedure: BILATERAL INFERIOR TURBINATE REDUCTION;  Surgeon: Jerrell Belfast, MD;  Location: [place];  Service: ENT;  Laterality: Bilateral;     OB History   No obstetric history on file.     Family History  Problem Relation Age of Onset  . Diabetes Father   . Hypertension Father   . Heart disease Father   . Breast cancer Neg Hx     Social History   Tobacco Use  . Smoking status: Never Smoker  . Smokeless tobacco: Never Used  Vaping Use  . Vaping Use: Never used  Substance Use Topics  . Alcohol use: Yes    Comment: occ  . Drug use: No    Home Medications Prior to Admission medications   Medication Sig Start Date End Date Taking? Authorizing Provider  cephALEXin (KEFLEX) 500 MG  capsule Take 1 capsule (500 mg total) by mouth 4 (four) times daily. 05/15/20   Davonna Belling, MD  cephALEXin (KEFLEX) 500 MG capsule Take 1 capsule (500 mg total) by mouth 4 (four) times daily. 06/05/20   Fredia Sorrow, MD  cetirizine (ZYRTEC) 10 MG tablet Take 10 mg by mouth at bedtime.     [provider]  Cholecalciferol (VITAMIN D3) 2000 units TABS Take 2,000 Units by mouth at bedtime.    [provider]  estradiol (VIVELLE-DOT) 0.075 MG/24HR Place 0.5 patches onto the skin 2 (two) times a week. Wednesday & Sunday 04/05/17   [provider]  Ferrous Sulfate Dried 200 (65 Fe) MG TABS Take 200 mg by mouth at bedtime.    [provider]  folic acid (FOLVITE) 1 MG tablet Take 2 mg by mouth at bedtime.   01/28/17   [provider]  Golimumab ([place] ARIA IV) Inject 1 Dose into the vein every 8 (eight) weeks.    [provider]  HYDROcodone-acetaminophen (NORCO/VICODIN) 5-325 MG tablet Take 1-2 tablets by mouth every 4 (four) hours as needed. 05/14/20   Davonna Belling, MD  ibuprofen (ADVIL,MOTRIN) 200 MG tablet Take 400 mg by mouth every 8 (eight) hours as needed (for headaches.).    [provider]  levothyroxine (SYNTHROID, LEVOTHROID) 100 MCG tablet Take 100 mcg by mouth daily before breakfast.  11/16/16   [provider]  Magnesium 250 MG TABS Take 500 mg by mouth at bedtime.     [provider]  methotrexate (RHEUMATREX) 2.5 MG tablet Take 20 mg by mouth every Tuesday at 6 PM. Taking 8 tabs every Tuesday 02/06/17   [provider]  naproxen sodium (ANAPROX) 220 MG tablet Take 220 mg by mouth every 8 (eight) hours as needed (for headaches.).    [provider]  omeprazole (PRILOSEC) 20 MG capsule Take 20 mg by mouth at bedtime.     [provider]  ondansetron (ZOFRAN-ODT) 4 MG disintegrating tablet Take 1 tablet (4 mg total) by mouth every 8 (eight) hours as needed for nausea or vomiting. 05/14/20   Davonna Belling, MD  progesterone (PROMETRIUM) 200 MG capsule Take 200 mg by mouth See admin instructions. TAKE 1 CAPSULE (200 MG) BY MOUTH DAILY FOR 14 DAYS EVERY OTHER MONTH 02/06/17   [provider]  verapamil (VERELAN PM) 120 MG 24 hr capsule Take 120 mg by mouth at bedtime.  01/20/17   [provider]    Allergies    Patient has no known allergies.  Review of Systems   Review of Systems  Constitutional: Positive for chills and fever.  HENT: Negative for rhinorrhea and sore throat.   Eyes: Negative for visual disturbance.  Respiratory: Negative for cough and shortness of breath.   Cardiovascular: Negative for chest pain and leg swelling.  Gastrointestinal: Negative for abdominal pain, diarrhea, nausea  and vomiting.  Genitourinary: Negative for dysuria.  Musculoskeletal: Negative for back pain and neck pain.  Skin: Negative for rash.  Neurological: Negative for dizziness, light-headedness and headaches.  Hematological: Does not bruise/bleed easily.  Psychiatric/Behavioral: Negative for confusion.    Physical Exam Updated Vital Signs BP 122/75   Pulse 73   Temp 99.7 F (37.6 C) (Oral)   Resp 13   LMP 07/23/2011   SpO2 95%   Physical Exam Vitals and nursing note reviewed.  Constitutional:      General: She is not in acute distress.    Appearance: Normal appearance. She is well-developed.  She is not ill-appearing or toxic-appearing.  HENT:     Head: Normocephalic and atraumatic.  Eyes:     Extraocular Movements: Extraocular movements intact.     Conjunctiva/sclera: Conjunctivae normal.     Pupils: Pupils are equal, round, and reactive to light.  Cardiovascular:     Rate and Rhythm: Normal rate and regular rhythm.     Heart sounds: No murmur heard.   Pulmonary:     Effort: Pulmonary effort is normal. No respiratory distress.     Breath sounds: Normal breath sounds.  Abdominal:     General: There is no distension.     Palpations: Abdomen is soft.     Tenderness: There is no abdominal tenderness. There is no guarding.  Musculoskeletal:        General: Normal range of motion.     Cervical back: Normal range of motion. Rigidity present.  Skin:    General: Skin is warm and dry.     Capillary Refill: Capillary refill takes less than 2 seconds.  Neurological:     General: No focal deficit present.     Mental Status: She is alert and oriented to person, place, and time.     Cranial Nerves: No cranial nerve deficit.     Sensory: No sensory deficit.     Motor: No weakness.     ED Results / Procedures / Treatments   Labs (all labs ordered are listed, but only abnormal results are displayed) Labs Reviewed  COMPREHENSIVE METABOLIC PANEL - Abnormal; Notable for the  following components:      Result Value   Glucose, Bld 133 (*)    Calcium 8.6 (*)    AST 14 (*)    All other components within normal limits  CBC WITH DIFFERENTIAL/PLATELET - Abnormal; Notable for the following components:   RBC 3.68 (*)    Hemoglobin 11.4 (*)    HCT 34.2 (*)    All other components within normal limits  URINALYSIS, ROUTINE W REFLEX MICROSCOPIC - Abnormal; Notable for the following components:   Hgb urine dipstick SMALL (*)    Ketones, ur 15 (*)    Protein, ur 30 (*)    Leukocytes,Ua TRACE (*)    All other components within normal limits  URINALYSIS, MICROSCOPIC (REFLEX) - Abnormal; Notable for the following components:   Bacteria, UA RARE (*)    All other components within normal limits  URINE CULTURE  LACTIC ACID, PLASMA  LACTIC ACID, PLASMA    EKG None  Radiology CT Renal Stone Study  Result Date: 06/05/2020 CLINICAL DATA:  Urinary tract stone, symptomatic/complicated Fever and chills.  Patient reports recent kidney stone with surgery. EXAM: CT ABDOMEN AND PELVIS WITHOUT CONTRAST TECHNIQUE: Multidetector CT imaging of the abdomen and pelvis was performed following the standard protocol without IV contrast. COMPARISON:  CT 05/21/2020 FINDINGS: Lower chest: Trace right pleural thickening/effusion. No focal airspace disease. Hiatal hernia. Hepatobiliary: Mild hepatic steatosis without evidence of focal lesion. No calcified gallstone or biliary dilatation. Pancreas: No ductal dilatation or inflammation. Spleen: Normal in size without focal abnormality. Adrenals/Urinary Tract: No adrenal nodule. Right hydroureteronephrosis. There is a 4 mm stone in the distal right ureter, series 2, image 59, similar to prior. Increased perinephric and periureteric stranding with urothelial thickening proximally. The previous punctate nonobstructing stones in the right kidney are less prominent than on prior exam. There are nonobstructing stones in the left kidney without left  hydronephrosis. No left ureteral calculi. No bladder stone. Stomach/Bowel: Small to moderate-sized  hiatal hernia. Normal positioning of the duodenum and ligament of Treitz. Few scattered air in fluid-filled small bowel loops in the central abdomen likely generalized ileus. No obstruction or inflammation. Normal appendix. Moderate volume of stool in the proximal colon. No colonic wall thickening or inflammation. Vascular/Lymphatic: Aortic atherosclerosis without aneurysm. Mild central mesenteric edema with small mesenteric nodes, similar to prior exam. No enlarged lymph nodes by size criteria. Reproductive: There are endometrial calcifications. No adnexal mass. Other: Tiny fat containing umbilical hernia no ascites. No free air. Musculoskeletal: Mild scoliosis and degenerative change in the spine. Hemi transitional lumbosacral anatomy. There are no acute or suspicious osseous abnormalities. IMPRESSION: 1. Unchanged 4 mm stone in the distal right ureter. The degree of right hydroureteronephrosis is minimally increased from prior exam. Increased perinephric and periureteric stranding with urothelial thickening proximally, may represent superimposed infection. 2. Nonobstructing stones in the right kidney appears diminished from prior. There are multiple nonobstructing stones in the left kidney. 3. Hepatic steatosis. 4. Moderate-sized hiatal hernia. Aortic Atherosclerosis (ICD10-I70.0). Electronically Signed   By: Keith Rake M.D.   On: 06/05/2020 21:29    Procedures Procedures (including critical care time)  Medications Ordered in ED Medications  sodium chloride flush (NS) 0.9 % injection 3 mL (has no administration in time range)  acetaminophen (TYLENOL) tablet 650 mg (650 mg Oral Given 06/05/20 1932)  cefTRIAXone (ROCEPHIN) 1 g in sodium chloride 0.9 % 100 mL IVPB (0 g Intravenous Stopped 06/05/20 2309)    ED Course  I have reviewed the triage vital signs and the nursing notes.  Pertinent labs &  imaging results that were available during my care of the patient were reviewed by me and considered in my medical decision making (see chart for details).    MDM Rules/Calculators/A&P                          Although patient had no flank pain or abdominal pain based on her past history.  And the fever and chills with concern for infection related to one of the kidney stones.  CT renal scan was done.  There was a persistent right-sided 4 mm stone.  And there was some evidence of may be a superimposed infection.  So patient received 1 g of Rocephin here.  Tylenol brought her fever down to 99.  Patient without any septic vital signs.  Patient nontoxic in appearance.  Will continue Keflex urine sent for culture.  Have her follow-up with her [facility] urologist tomorrow.      Final Clinical Impression(s) / ED Diagnoses Final diagnoses:  Right ureteral stone  Fever, unspecified fever cause    Rx / DC Orders ED Discharge Orders         Ordered    cephALEXin (KEFLEX) 500 MG capsule  4 times daily     Discontinue  Reprint     06/05/20 2337           Fredia Sorrow, MD 06/05/20 2342

## 2020-06-05 NOTE — Discharge Instructions (Addendum)
Take the Keflex as directed.  Tylenol as needed for fever.  Call your urologist in the morning.  Urine sent for culture.  He will have evidence of right-sided stones could be an infection on that side.

## 2020-06-08 LAB — URINE CULTURE: Culture: 30000 — AB

## 2020-06-09 NOTE — Progress Notes (Signed)
ED Antimicrobial Stewardship Positive Culture Follow Up   Valerie Pacheco is an 59 y.o. female who presented to [facility] on 06/05/2020 with a chief complaint of fever and chills Chief Complaint  Patient presents with   Fever    Recent Results (from the past 720 hour(s))  Urine culture     Status: Abnormal   Collection Time: 05/14/20 10:30 PM   Specimen: Urine, Clean Catch  Result Value Ref Range Status   Specimen Description   Final    URINE, CLEAN CATCH Performed at [facility], [address] [place], [place] [address]    Special Requests   Final    NONE Performed at [facility], [address] [place], [place] [address]    Culture (A)  Final    40,000 COLONIES/mL GROUP B STREP(S.AGALACTIAE)ISOLATED TESTING AGAINST S. AGALACTIAE NOT ROUTINELY PERFORMED DUE TO PREDICTABILITY OF AMP/PEN/VAN SUSCEPTIBILITY. Performed at [facility], [place] [address], [place], [place] [address]    Report Status 05/16/2020 FINAL  Final  Urine Culture     Status: Abnormal   Collection Time: 06/05/20  7:25 PM   Specimen: Urine, Clean Catch  Result Value Ref Range Status   Specimen Description   Final    URINE, CLEAN CATCH Performed at [facility], [address] [place], [place] [address]    Special Requests   Final    NONE Performed at [facility], [address] [place], [place] [address]    Culture 30,000 COLONIES/mL PSEUDOMONAS AERUGINOSA (A)  Final   Report Status 06/08/2020 FINAL  Final   Organism ID, Bacteria PSEUDOMONAS AERUGINOSA (A)  Final      Susceptibility   Pseudomonas aeruginosa - MIC*    CEFTAZIDIME 8 SENSITIVE Sensitive     CIPROFLOXACIN 1 SENSITIVE Sensitive     GENTAMICIN <=1 SENSITIVE Sensitive     IMIPENEM 1 SENSITIVE Sensitive     * 30,000 COLONIES/mL PSEUDOMONAS AERUGINOSA    [x]  Treated with cephalexin, organism resistant to prescribed antimicrobial []  Patient discharged originally without antimicrobial  agent and treatment is now indicated  New antibiotic prescription: Ciprofloxacin 250 mg twice daily x 5 days   ED Provider: Carmon Sails ,PA-C    Albertina Parr, PharmD., BCPS, BCCCP Clinical Pharmacist Clinical phone for 06/09/20 until 10pm: [phone] If after 10pm, please refer to [facility] for unit-specific pharmacist

## 2020-06-10 ENCOUNTER — Telehealth: Payer: Self-pay | Admitting: Emergency Medicine

## 2020-06-10 NOTE — Telephone Encounter (Signed)
Post ED Visit - Positive Culture Follow-up: Unsuccessful Patient Follow-up  Culture assessed and recommendations reviewed by:  []  Elenor Quinones, Pharm.D. []  Heide Guile, Pharm.D., BCPS AQ-ID []  Parks Neptune, Pharm.D., BCPS []  Alycia Rossetti, Pharm.D., BCPS []  [place], Pharm.D., BCPS, AAHIVP []  Legrand Como, Pharm.D., BCPS, AAHIVP [x]  Albertina Parr, PharmD []  Vincenza Hews, PharmD, BCPS  Positive urine culture  []  Patient discharged without antimicrobial prescription and treatment is now indicated [x]  Organism is resistant to prescribed ED discharge antimicrobial []  Patient with positive blood cultures   Unable to contact patient by phone, letter will be sent to address on file  Plan: stop Cephalexin, Cipro 250 MG twice daily x five days - Carmon Sails, PA  Milus Mallick RN 06/10/2020, 5:14 PM

## 2020-06-11 DIAGNOSIS — N200 Calculus of kidney: Secondary | ICD-10-CM | POA: Diagnosis not present

## 2020-06-14 DIAGNOSIS — N200 Calculus of kidney: Secondary | ICD-10-CM | POA: Diagnosis not present

## 2020-06-18 DIAGNOSIS — M2550 Pain in unspecified joint: Secondary | ICD-10-CM | POA: Diagnosis not present

## 2020-06-18 DIAGNOSIS — Z79899 Other long term (current) drug therapy: Secondary | ICD-10-CM | POA: Diagnosis not present

## 2020-06-18 DIAGNOSIS — M0589 Other rheumatoid arthritis with rheumatoid factor of multiple sites: Secondary | ICD-10-CM | POA: Diagnosis not present

## 2020-06-18 DIAGNOSIS — Z111 Encounter for screening for respiratory tuberculosis: Secondary | ICD-10-CM | POA: Diagnosis not present

## 2020-07-02 ENCOUNTER — Other Ambulatory Visit: Payer: Self-pay | Admitting: Gynecology

## 2020-07-02 DIAGNOSIS — Z1231 Encounter for screening mammogram for malignant neoplasm of breast: Secondary | ICD-10-CM

## 2020-07-04 DIAGNOSIS — N200 Calculus of kidney: Secondary | ICD-10-CM | POA: Diagnosis not present

## 2020-07-10 DIAGNOSIS — M0589 Other rheumatoid arthritis with rheumatoid factor of multiple sites: Secondary | ICD-10-CM | POA: Diagnosis not present

## 2020-08-21 ENCOUNTER — Other Ambulatory Visit: Payer: Self-pay

## 2020-08-21 ENCOUNTER — Ambulatory Visit
Admission: RE | Admit: 2020-08-21 | Discharge: 2020-08-21 | Disposition: A | Discharge status: Home / Self Care | Payer: BC Managed Care – PPO | Source: Ambulatory Visit | Attending: Gynecology | Admitting: Gynecology

## 2020-08-21 DIAGNOSIS — Z1231 Encounter for screening mammogram for malignant neoplasm of breast: Secondary | ICD-10-CM | POA: Diagnosis not present

## 2020-09-04 DIAGNOSIS — M0589 Other rheumatoid arthritis with rheumatoid factor of multiple sites: Secondary | ICD-10-CM | POA: Diagnosis not present

## 2020-09-10 DIAGNOSIS — K449 Diaphragmatic hernia without obstruction or gangrene: Secondary | ICD-10-CM | POA: Diagnosis not present

## 2020-09-10 DIAGNOSIS — K219 Gastro-esophageal reflux disease without esophagitis: Secondary | ICD-10-CM | POA: Diagnosis not present

## 2020-09-21 DIAGNOSIS — Z1159 Encounter for screening for other viral diseases: Secondary | ICD-10-CM | POA: Diagnosis not present

## 2020-09-24 DIAGNOSIS — K2930 Chronic superficial gastritis without bleeding: Secondary | ICD-10-CM | POA: Diagnosis not present

## 2020-09-24 DIAGNOSIS — K449 Diaphragmatic hernia without obstruction or gangrene: Secondary | ICD-10-CM | POA: Diagnosis not present

## 2020-09-24 DIAGNOSIS — K259 Gastric ulcer, unspecified as acute or chronic, without hemorrhage or perforation: Secondary | ICD-10-CM | POA: Diagnosis not present

## 2020-09-24 DIAGNOSIS — K2289 Other specified disease of esophagus: Secondary | ICD-10-CM | POA: Diagnosis not present

## 2020-09-24 DIAGNOSIS — K219 Gastro-esophageal reflux disease without esophagitis: Secondary | ICD-10-CM | POA: Diagnosis not present

## 2020-10-16 DIAGNOSIS — G43719 Chronic migraine without aura, intractable, without status migrainosus: Secondary | ICD-10-CM | POA: Diagnosis not present

## 2020-10-16 DIAGNOSIS — G43019 Migraine without aura, intractable, without status migrainosus: Secondary | ICD-10-CM | POA: Diagnosis not present

## 2020-10-30 DIAGNOSIS — M0589 Other rheumatoid arthritis with rheumatoid factor of multiple sites: Secondary | ICD-10-CM | POA: Diagnosis not present

## 2021-01-10 DIAGNOSIS — Z79899 Other long term (current) drug therapy: Secondary | ICD-10-CM | POA: Diagnosis not present

## 2021-01-10 DIAGNOSIS — M0589 Other rheumatoid arthritis with rheumatoid factor of multiple sites: Secondary | ICD-10-CM | POA: Diagnosis not present

## 2021-02-06 DIAGNOSIS — K219 Gastro-esophageal reflux disease without esophagitis: Secondary | ICD-10-CM | POA: Diagnosis not present

## 2021-02-06 DIAGNOSIS — K2970 Gastritis, unspecified, without bleeding: Secondary | ICD-10-CM | POA: Diagnosis not present

## 2021-02-06 DIAGNOSIS — Z0000 Encounter for general adult medical examination without abnormal findings: Secondary | ICD-10-CM | POA: Diagnosis not present

## 2021-02-06 DIAGNOSIS — K449 Diaphragmatic hernia without obstruction or gangrene: Secondary | ICD-10-CM | POA: Diagnosis not present

## 2021-02-06 DIAGNOSIS — K648 Other hemorrhoids: Secondary | ICD-10-CM | POA: Diagnosis not present

## 2021-02-06 DIAGNOSIS — Z13220 Encounter for screening for lipoid disorders: Secondary | ICD-10-CM | POA: Diagnosis not present

## 2021-02-06 DIAGNOSIS — E039 Hypothyroidism, unspecified: Secondary | ICD-10-CM | POA: Diagnosis not present

## 2021-03-06 DIAGNOSIS — Z6826 Body mass index (BMI) 26.0-26.9, adult: Secondary | ICD-10-CM | POA: Diagnosis not present

## 2021-03-06 DIAGNOSIS — L293 Anogenital pruritus, unspecified: Secondary | ICD-10-CM | POA: Diagnosis not present

## 2021-03-06 DIAGNOSIS — Z780 Asymptomatic menopausal state: Secondary | ICD-10-CM | POA: Diagnosis not present

## 2021-03-06 DIAGNOSIS — Z01419 Encounter for gynecological examination (general) (routine) without abnormal findings: Secondary | ICD-10-CM | POA: Diagnosis not present

## 2021-03-06 DIAGNOSIS — Z1151 Encounter for screening for human papillomavirus (HPV): Secondary | ICD-10-CM | POA: Diagnosis not present

## 2021-03-06 DIAGNOSIS — Z130 Encounter for screening for diseases of the blood and blood-forming organs and certain disorders involving the immune mechanism: Secondary | ICD-10-CM | POA: Diagnosis not present

## 2021-03-06 DIAGNOSIS — Z124 Encounter for screening for malignant neoplasm of cervix: Secondary | ICD-10-CM | POA: Diagnosis not present

## 2021-03-07 ENCOUNTER — Other Ambulatory Visit: Payer: Self-pay

## 2021-03-07 ENCOUNTER — Ambulatory Visit: Payer: BC Managed Care – PPO | Admitting: Dermatology

## 2021-03-07 ENCOUNTER — Encounter: Payer: Self-pay | Admitting: Dermatology

## 2021-03-07 DIAGNOSIS — M2550 Pain in unspecified joint: Secondary | ICD-10-CM | POA: Diagnosis not present

## 2021-03-07 DIAGNOSIS — Z1283 Encounter for screening for malignant neoplasm of skin: Secondary | ICD-10-CM

## 2021-03-07 DIAGNOSIS — L570 Actinic keratosis: Secondary | ICD-10-CM | POA: Diagnosis not present

## 2021-03-07 DIAGNOSIS — Z79899 Other long term (current) drug therapy: Secondary | ICD-10-CM | POA: Diagnosis not present

## 2021-03-07 DIAGNOSIS — H5203 Hypermetropia, bilateral: Secondary | ICD-10-CM | POA: Diagnosis not present

## 2021-03-07 DIAGNOSIS — H2513 Age-related nuclear cataract, bilateral: Secondary | ICD-10-CM | POA: Diagnosis not present

## 2021-03-07 DIAGNOSIS — M0589 Other rheumatoid arthritis with rheumatoid factor of multiple sites: Secondary | ICD-10-CM | POA: Diagnosis not present

## 2021-03-08 DIAGNOSIS — M0589 Other rheumatoid arthritis with rheumatoid factor of multiple sites: Secondary | ICD-10-CM | POA: Diagnosis not present

## 2021-03-08 DIAGNOSIS — Z79899 Other long term (current) drug therapy: Secondary | ICD-10-CM | POA: Diagnosis not present

## 2021-03-18 ENCOUNTER — Encounter: Payer: Self-pay | Admitting: Dermatology

## 2021-03-18 NOTE — Progress Notes (Signed)
   New Patient   Subjective  Valerie Pacheco is a 60 y.o. female who presents for the following: Skin Problem (Right side of face x 6 months- does itch sometime and its crusty).  Crust on right cheek plus check other spots Location:  Duration:  Quality:  Associated Signs/Symptoms: Modifying Factors:  Severity:  Timing: Context:    The following portions of the chart were reviewed this encounter and updated as appropriate:  Tobacco  Allergies  Meds  Problems  Med Hx  Surg Hx  Fam Hx      Objective  Well appearing patient in no apparent distress; mood and affect are within normal limits. Objective  Right Parotid Area: Gritty flat pink 7 mm crust  Objective  Mid Back: Exposed areas and back examined: No atypical pigmented lesions or nonmelanoma skin cancer.    All sun exposed areas plus back examined.   Assessment & Plan  AK (actinic keratosis) Right Parotid Area  Destruction of lesion - Right Parotid Area Complexity: simple   Destruction method: cryotherapy   Informed consent: discussed and consent obtained   Timeout:  patient name, date of birth, surgical site, and procedure verified Lesion destroyed using liquid nitrogen: Yes   Cryotherapy cycles:  3 Outcome: patient tolerated procedure well with no complications    Encounter for screening for malignant neoplasm of skin Mid Back  Annual skin examination, encouraged twice annual self examination with spouse.  Continued ultraviolet protection.

## 2021-04-03 DIAGNOSIS — G43019 Migraine without aura, intractable, without status migrainosus: Secondary | ICD-10-CM | POA: Diagnosis not present

## 2021-04-03 DIAGNOSIS — G43719 Chronic migraine without aura, intractable, without status migrainosus: Secondary | ICD-10-CM | POA: Diagnosis not present

## 2021-05-01 ENCOUNTER — Ambulatory Visit: Payer: BC Managed Care – PPO | Admitting: Dermatology

## 2021-05-03 DIAGNOSIS — Z79899 Other long term (current) drug therapy: Secondary | ICD-10-CM | POA: Diagnosis not present

## 2021-05-03 DIAGNOSIS — M0589 Other rheumatoid arthritis with rheumatoid factor of multiple sites: Secondary | ICD-10-CM | POA: Diagnosis not present

## 2021-05-27 ENCOUNTER — Ambulatory Visit: Payer: BC Managed Care – PPO | Admitting: Dermatology

## 2021-06-07 ENCOUNTER — Other Ambulatory Visit: Payer: Self-pay | Admitting: Gynecology

## 2021-06-07 DIAGNOSIS — Z1231 Encounter for screening mammogram for malignant neoplasm of breast: Secondary | ICD-10-CM

## 2021-06-28 DIAGNOSIS — R5383 Other fatigue: Secondary | ICD-10-CM | POA: Diagnosis not present

## 2021-06-28 DIAGNOSIS — Z111 Encounter for screening for respiratory tuberculosis: Secondary | ICD-10-CM | POA: Diagnosis not present

## 2021-06-28 DIAGNOSIS — M0589 Other rheumatoid arthritis with rheumatoid factor of multiple sites: Secondary | ICD-10-CM | POA: Diagnosis not present

## 2021-06-28 DIAGNOSIS — Z79899 Other long term (current) drug therapy: Secondary | ICD-10-CM | POA: Diagnosis not present

## 2021-08-01 IMAGING — CT CT ABD-PELV W/ CM
2 of 5 series · 16 of 46 positions shown, 18 images · IV contrast (Omnipaque)
Comparison: 06/19/2008 CT pelvis

CLINICAL DATA: 58-year-old female with abdominal pain for 1 week
and diarrhea for 3 weeks.

EXAM:
CT ABDOMEN AND PELVIS WITH CONTRAST
TECHNIQUE: Multidetector CT imaging of the abdomen and pelvis was performed
using the standard protocol following bolus administration of
intravenous contrast.
CONTRAST:  100mL OMNIPAQUE IOHEXOL 300 MG/ML  SOLN

[Series 2: axial st · axial · 0.89mm/px · z∈[-494,-69]mm · 13 of 96 slices shown, 15 images]
[im 6/96  soft-tissue]
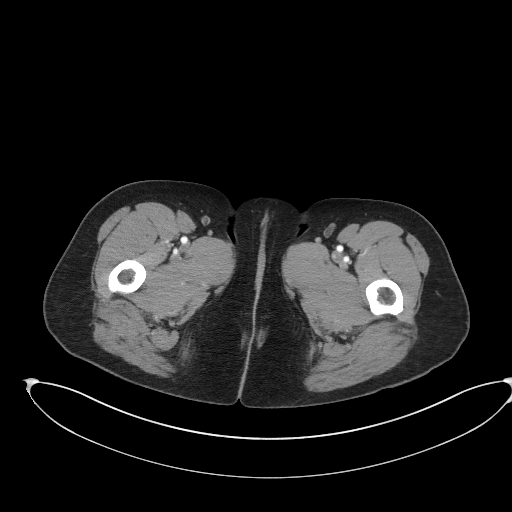
[im 6/96  bone]
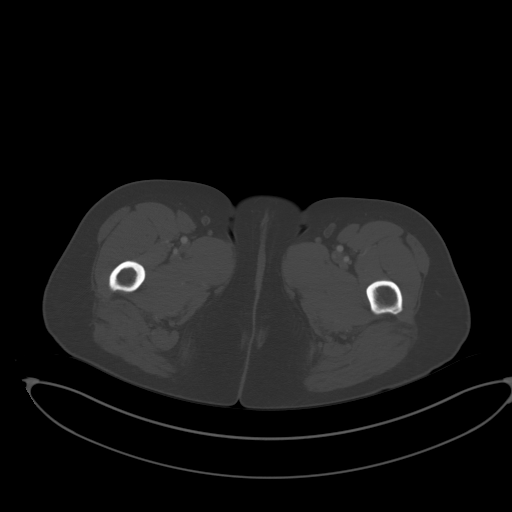
[im 16/96  soft-tissue]
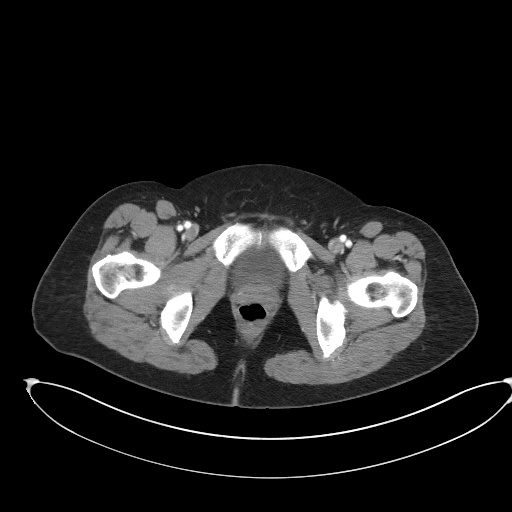
[im 21/96  soft-tissue]
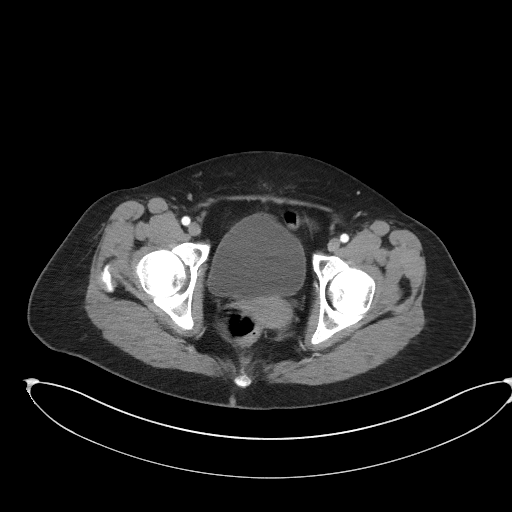
[im 26/96  soft-tissue]
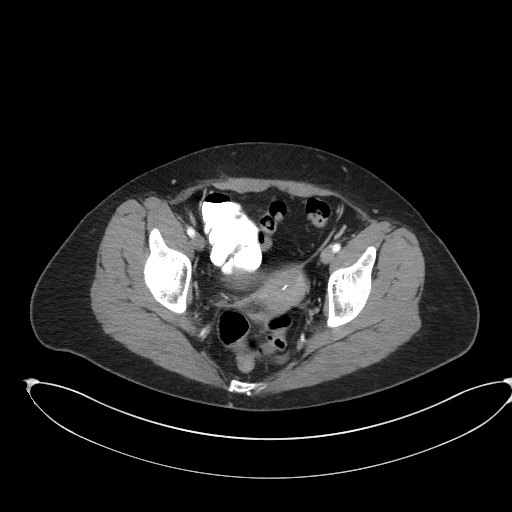
[im 36/96  soft-tissue]
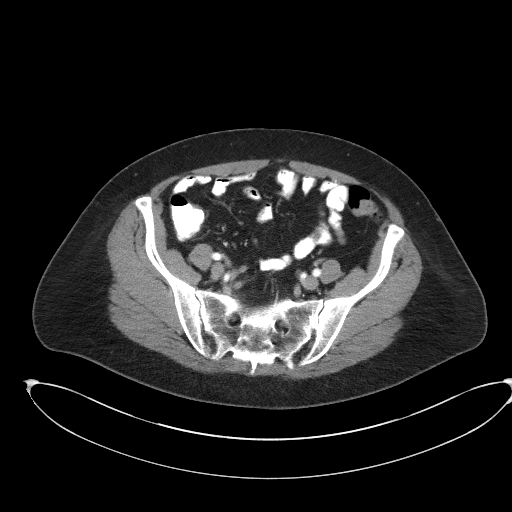
[im 41/96  soft-tissue]
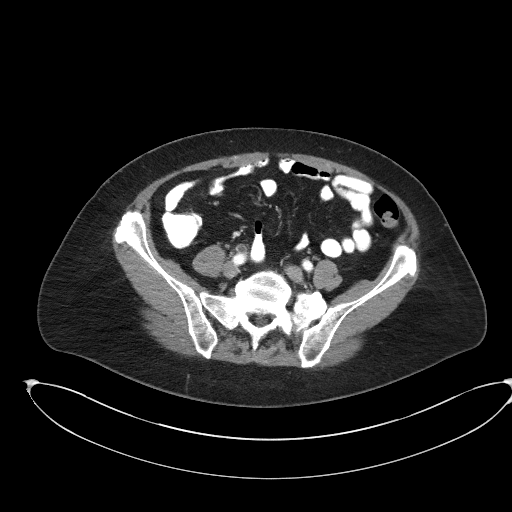
[im 51/96  soft-tissue]
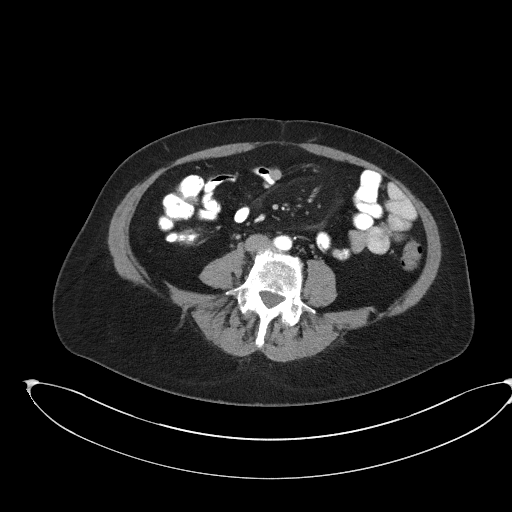
[im 56/96  soft-tissue]
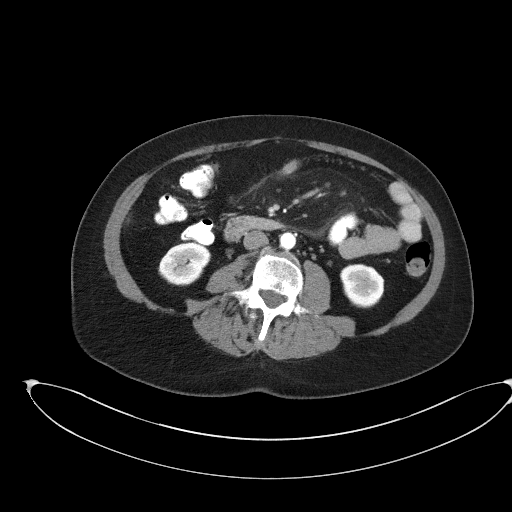
[im 61/96  soft-tissue]
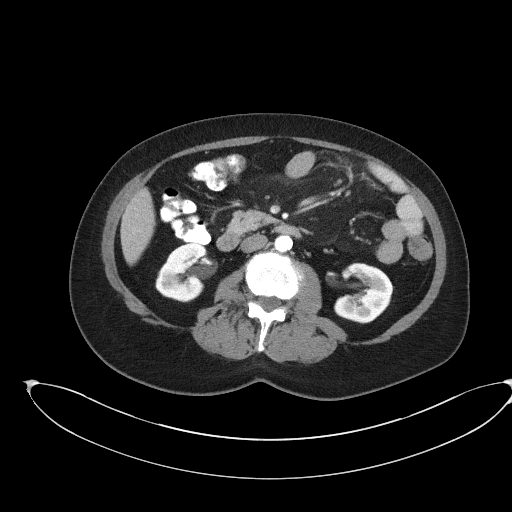
[im 61/96  bone]
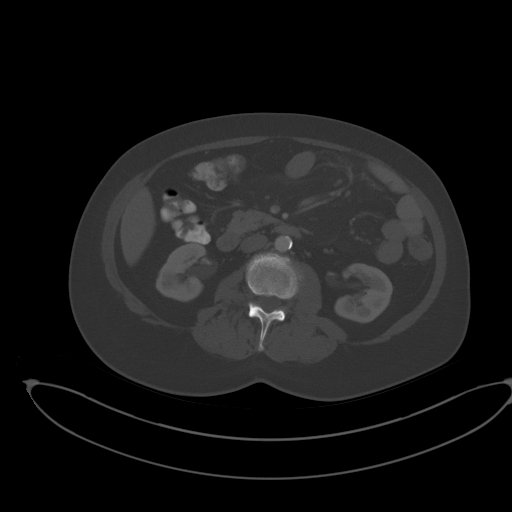
[im 71/96  soft-tissue]
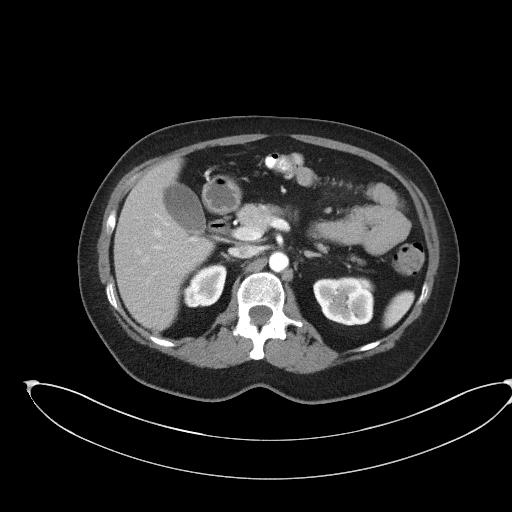
[im 76/96  soft-tissue]
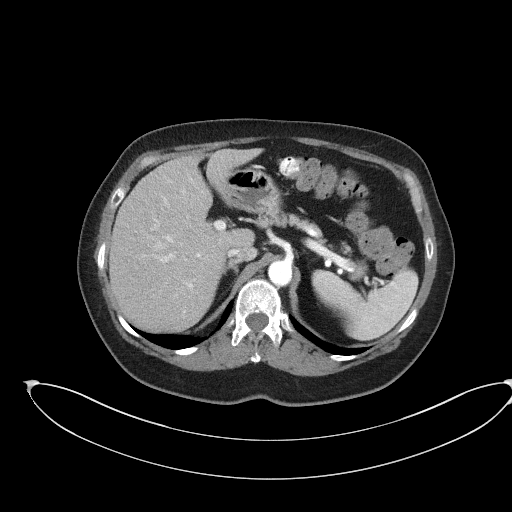
[im 81/96  soft-tissue]
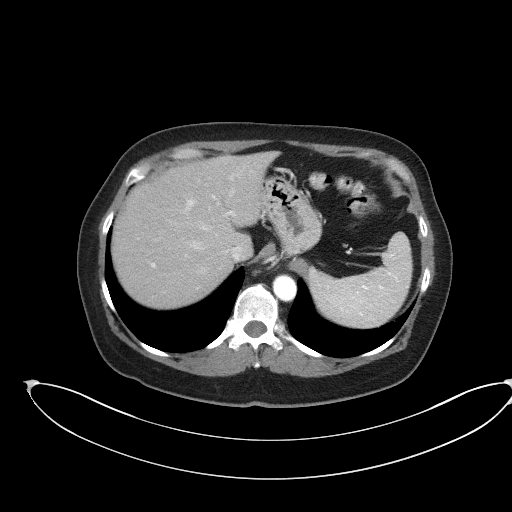
[im 91/96  soft-tissue]
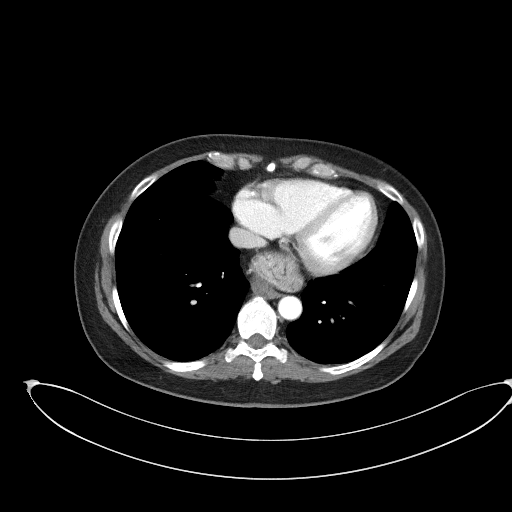

[Series 5: coronal st · coronal · 0.75mm/px · 3 of 90 slices shown]
[im 30/90  soft-tissue]
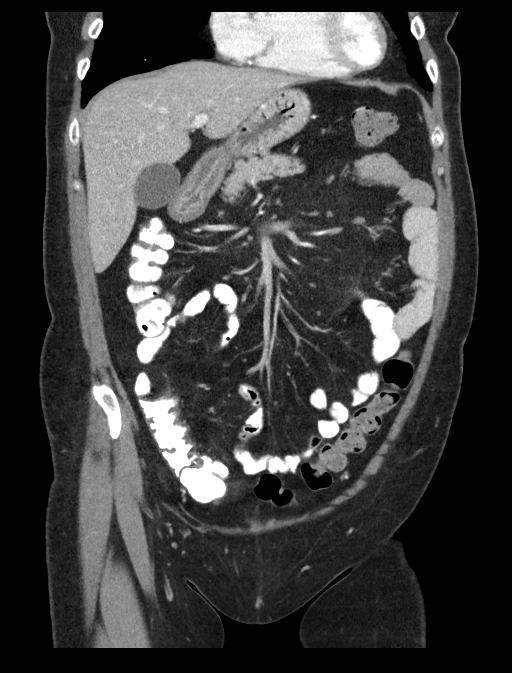
[im 40/90  soft-tissue]
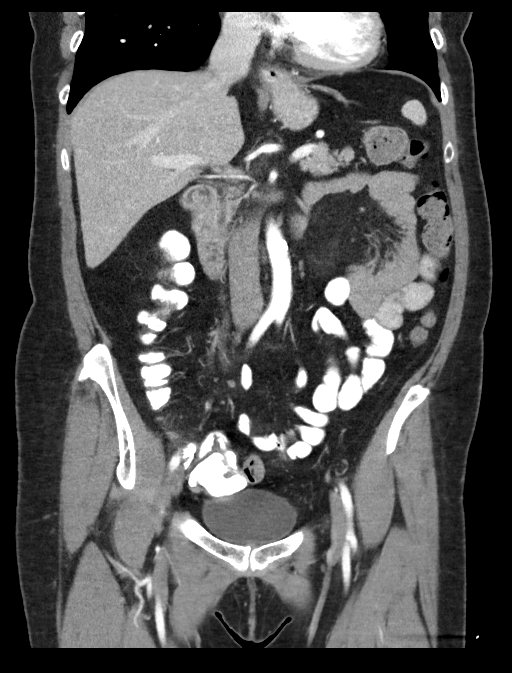
[im 50/90  soft-tissue]
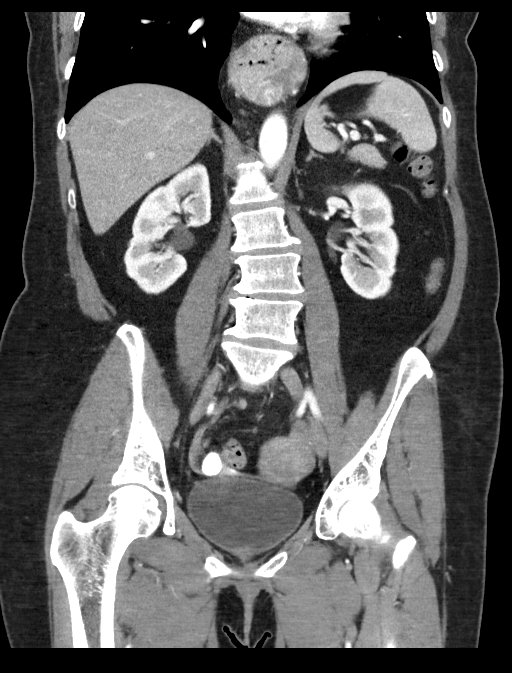

[16 of 46 positions shown; findings below may reference images not displayed]

FINDINGS: Lower chest: No acute abnormality.

Hepatobiliary: No significant hepatic or gallbladder abnormalities.

Pancreas: Unremarkable

Spleen: Unremarkable

Adrenals/Urinary Tract: A 4 mm mid RIGHT ureteral calculus is
present at the iliac crossover and causing mild RIGHT hydroureter.

Multiple punctate nonobstructing renal calculi are present.

The adrenal glands and bladder are unremarkable.

Stomach/Bowel: A large hiatal hernia is again noted. Appendix
appears normal. No evidence of bowel wall thickening, distention, or
inflammatory changes.

Vascular/Lymphatic: Aortic atherosclerosis. No enlarged abdominal or
pelvic lymph nodes.

Reproductive: Uterus and bilateral adnexa are unremarkable.

Other: No ascites, focal collection or pneumoperitoneum.

Musculoskeletal: No acute or suspicious bony abnormalities are
noted.
IMPRESSION: 1. 4 mm mid RIGHT ureteral calculus (at the iliac crossover) causing
mild RIGHT hydroureter.
2. Punctate nonobstructing bilateral renal calculi.
3. Large hiatal hernia.
4. Aortic Atherosclerosis ([id number]).

## 2021-08-28 ENCOUNTER — Ambulatory Visit
Admission: RE | Admit: 2021-08-28 | Discharge: 2021-08-28 | Disposition: A | Discharge status: Home / Self Care | Payer: BC Managed Care – PPO | Source: Ambulatory Visit | Attending: Gynecology | Admitting: Gynecology

## 2021-08-28 ENCOUNTER — Other Ambulatory Visit: Payer: Self-pay

## 2021-08-28 DIAGNOSIS — Z79899 Other long term (current) drug therapy: Secondary | ICD-10-CM | POA: Diagnosis not present

## 2021-08-28 DIAGNOSIS — Z1231 Encounter for screening mammogram for malignant neoplasm of breast: Secondary | ICD-10-CM | POA: Diagnosis not present

## 2021-08-28 DIAGNOSIS — M0589 Other rheumatoid arthritis with rheumatoid factor of multiple sites: Secondary | ICD-10-CM | POA: Diagnosis not present

## 2021-08-31 DIAGNOSIS — C4491 Basal cell carcinoma of skin, unspecified: Secondary | ICD-10-CM

## 2021-08-31 HISTORY — DX: Basal cell carcinoma of skin, unspecified: C44.91

## 2021-09-02 DIAGNOSIS — Z79899 Other long term (current) drug therapy: Secondary | ICD-10-CM | POA: Diagnosis not present

## 2021-09-02 DIAGNOSIS — M2550 Pain in unspecified joint: Secondary | ICD-10-CM | POA: Diagnosis not present

## 2021-09-02 DIAGNOSIS — M0589 Other rheumatoid arthritis with rheumatoid factor of multiple sites: Secondary | ICD-10-CM | POA: Diagnosis not present

## 2021-10-23 DIAGNOSIS — M0589 Other rheumatoid arthritis with rheumatoid factor of multiple sites: Secondary | ICD-10-CM | POA: Diagnosis not present

## 2021-10-23 DIAGNOSIS — Z79899 Other long term (current) drug therapy: Secondary | ICD-10-CM | POA: Diagnosis not present

## 2021-10-23 DIAGNOSIS — G43719 Chronic migraine without aura, intractable, without status migrainosus: Secondary | ICD-10-CM | POA: Diagnosis not present

## 2021-10-23 DIAGNOSIS — G43019 Migraine without aura, intractable, without status migrainosus: Secondary | ICD-10-CM | POA: Diagnosis not present

## 2021-11-19 ENCOUNTER — Ambulatory Visit: Payer: BC Managed Care – PPO | Admitting: Dermatology

## 2021-11-19 ENCOUNTER — Other Ambulatory Visit: Payer: Self-pay

## 2021-11-19 ENCOUNTER — Encounter: Payer: Self-pay | Admitting: Dermatology

## 2021-11-19 DIAGNOSIS — C44319 Basal cell carcinoma of skin of other parts of face: Secondary | ICD-10-CM

## 2021-11-19 DIAGNOSIS — D485 Neoplasm of uncertain behavior of skin: Secondary | ICD-10-CM

## 2021-11-26 ENCOUNTER — Telehealth: Payer: Self-pay

## 2021-11-26 NOTE — Telephone Encounter (Signed)
-----   Message from Lavonna Monarch, MD sent at 11/21/2021  6:04 PM EST ----- Although this is not a high risk lesion, it is in an old scar with ill-defined margins so I do recommend Mohs surgery.

## 2021-11-28 ENCOUNTER — Telehealth: Payer: Self-pay | Admitting: *Deleted

## 2021-11-28 NOTE — Telephone Encounter (Signed)
-----   Message from Lavonna Monarch, MD sent at 11/21/2021  6:04 PM EST ----- Although this is not a high risk lesion, it is in an old scar with ill-defined margins so I do recommend Mohs surgery.

## 2021-11-28 NOTE — Telephone Encounter (Signed)
Pathology results to patient- referral sent to skin surgery center for MOHS.

## 2021-12-14 ENCOUNTER — Encounter: Payer: Self-pay | Admitting: Dermatology

## 2021-12-14 NOTE — Progress Notes (Addendum)
° °  Follow-Up Visit   Subjective  SHARLEY KEELER is a 61 y.o. female who presents for the following: Follow-up (Right parotid area- came back- Ln[place] last office visit).  Check face, area treated with freezing did not completely clear Location:  Duration:  Quality:  Associated Signs/Symptoms: Modifying Factors:  Severity:  Timing: Context:   Objective  Well appearing patient in no apparent distress; mood and affect are within normal limits. Right Parotid Area Subtle endophytic waxy 7 mm lesion at superior margin hypopigmented scar perhaps secondary to previous freezing.       A focused examination was performed including head and neck. Relevant physical exam findings are noted in the Assessment and Plan.   Assessment & Plan    Neoplasm of uncertain behavior of skin Right Parotid Area  Skin / nail biopsy Type of biopsy: tangential   Informed consent: discussed and consent obtained   Timeout: patient name, date of birth, surgical site, and procedure verified   Anesthesia: the lesion was anesthetized in a standard fashion   Anesthetic:  1% lidocaine w/ epinephrine 1-100,000 local infiltration Instrument used: flexible razor blade   Hemostasis achieved with: ferric subsulfate   Outcome: patient tolerated procedure well   Post-procedure details: sterile dressing applied and wound care instructions given   Dressing type: bandage and petrolatum    Specimen 1 - Surgical pathology Differential Diagnosis: R/O BCC VS SCC  Check Margins: No  Biopsy to rule out carcinoma, may consider Mohs surgery if positive.      I, Lavonna Monarch, MD, have reviewed all documentation for this visit.  The documentation on 01/06/22 for the exam, diagnosis, procedures, and orders are all accurate and complete.

## 2021-12-23 DIAGNOSIS — M0589 Other rheumatoid arthritis with rheumatoid factor of multiple sites: Secondary | ICD-10-CM | POA: Diagnosis not present

## 2021-12-23 DIAGNOSIS — Z79899 Other long term (current) drug therapy: Secondary | ICD-10-CM | POA: Diagnosis not present

## 2021-12-23 DIAGNOSIS — M0579 Rheumatoid arthritis with rheumatoid factor of multiple sites without organ or systems involvement: Secondary | ICD-10-CM | POA: Diagnosis not present

## 2022-01-09 DIAGNOSIS — C44319 Basal cell carcinoma of skin of other parts of face: Secondary | ICD-10-CM | POA: Diagnosis not present

## 2022-02-13 DIAGNOSIS — Z0000 Encounter for general adult medical examination without abnormal findings: Secondary | ICD-10-CM | POA: Diagnosis not present

## 2022-02-13 DIAGNOSIS — R400 Somnolence: Secondary | ICD-10-CM | POA: Diagnosis not present

## 2022-02-13 DIAGNOSIS — K219 Gastro-esophageal reflux disease without esophagitis: Secondary | ICD-10-CM | POA: Diagnosis not present

## 2022-02-13 DIAGNOSIS — Z8669 Personal history of other diseases of the nervous system and sense organs: Secondary | ICD-10-CM | POA: Diagnosis not present

## 2022-02-13 DIAGNOSIS — E039 Hypothyroidism, unspecified: Secondary | ICD-10-CM | POA: Diagnosis not present

## 2022-02-13 DIAGNOSIS — Z13220 Encounter for screening for lipoid disorders: Secondary | ICD-10-CM | POA: Diagnosis not present

## 2022-02-24 DIAGNOSIS — Z79899 Other long term (current) drug therapy: Secondary | ICD-10-CM | POA: Diagnosis not present

## 2022-02-24 DIAGNOSIS — M0589 Other rheumatoid arthritis with rheumatoid factor of multiple sites: Secondary | ICD-10-CM | POA: Diagnosis not present

## 2022-03-05 DIAGNOSIS — G4719 Other hypersomnia: Secondary | ICD-10-CM | POA: Diagnosis not present

## 2022-03-18 ENCOUNTER — Encounter (INDEPENDENT_AMBULATORY_CARE_PROVIDER_SITE_OTHER): Payer: Self-pay

## 2022-03-18 ENCOUNTER — Encounter: Payer: Self-pay | Admitting: Dermatology

## 2022-03-18 ENCOUNTER — Ambulatory Visit: Payer: BC Managed Care – PPO | Admitting: Dermatology

## 2022-03-18 DIAGNOSIS — Z85828 Personal history of other malignant neoplasm of skin: Secondary | ICD-10-CM

## 2022-03-18 DIAGNOSIS — Z130 Encounter for screening for diseases of the blood and blood-forming organs and certain disorders involving the immune mechanism: Secondary | ICD-10-CM | POA: Diagnosis not present

## 2022-03-18 DIAGNOSIS — L738 Other specified follicular disorders: Secondary | ICD-10-CM

## 2022-03-18 DIAGNOSIS — N904 Leukoplakia of vulva: Secondary | ICD-10-CM | POA: Diagnosis not present

## 2022-03-18 DIAGNOSIS — Z01419 Encounter for gynecological examination (general) (routine) without abnormal findings: Secondary | ICD-10-CM | POA: Diagnosis not present

## 2022-03-18 DIAGNOSIS — Z780 Asymptomatic menopausal state: Secondary | ICD-10-CM | POA: Diagnosis not present

## 2022-03-18 NOTE — Patient Instructions (Signed)

## 2022-03-19 DIAGNOSIS — Z79899 Other long term (current) drug therapy: Secondary | ICD-10-CM | POA: Diagnosis not present

## 2022-03-19 DIAGNOSIS — M0589 Other rheumatoid arthritis with rheumatoid factor of multiple sites: Secondary | ICD-10-CM | POA: Diagnosis not present

## 2022-03-19 DIAGNOSIS — M545 Low back pain, unspecified: Secondary | ICD-10-CM | POA: Diagnosis not present

## 2022-04-04 ENCOUNTER — Encounter: Payer: Self-pay | Admitting: Dermatology

## 2022-04-04 NOTE — Progress Notes (Signed)
? ?  Follow-Up Visit ?  ?Subjective  ?Valerie Pacheco is a 61 y.o. female who presents for the following: Follow-up (Bcc follow up after mohs right parotid area healed well with no new concerns). ? ?Follow-up skin cancer plus check spot on nose ?Location:  ?Duration:  ?Quality:  ?Associated Signs/Symptoms: ?Modifying Factors:  ?Severity:  ?Timing: ?Context:  ? ?Objective  ?Well appearing patient in no apparent distress; mood and affect are within normal limits. ?Right Parotid Area ?Right parotid area Healed great with very little scar ? ?New biopsy needed on the bulb of the nose and due to travel we will defer biopsy today. ? ? ? ? ?Mid Forehead ?Flesh-colored papules with eccentric dell, typical dermoscopy ? ? ? ?A focused examination was performed including head and neck. Relevant physical exam findings are noted in the Assessment and Plan. ? ? ?Assessment & Plan  ? ? ?History of basal cell carcinoma (BCC) ?Right Parotid Area ? ?Schedule biopsy at follow-up ? ?Sebaceous hyperplasia of face ?Mid Forehead ? ?Told of similar appearance of early [place] so if there is growth or bleeding return for biopsy ? ? ? ? ? ?I, Lavonna Monarch, MD, have reviewed all documentation for this visit.  The documentation on 04/04/22 for the exam, diagnosis, procedures, and orders are all accurate and complete. ?

## 2022-04-21 ENCOUNTER — Ambulatory Visit: Payer: BC Managed Care – PPO | Admitting: Dermatology

## 2022-04-21 ENCOUNTER — Encounter: Payer: Self-pay | Admitting: Dermatology

## 2022-04-21 DIAGNOSIS — R238 Other skin changes: Secondary | ICD-10-CM | POA: Diagnosis not present

## 2022-04-21 DIAGNOSIS — M0589 Other rheumatoid arthritis with rheumatoid factor of multiple sites: Secondary | ICD-10-CM | POA: Diagnosis not present

## 2022-04-21 DIAGNOSIS — Z79899 Other long term (current) drug therapy: Secondary | ICD-10-CM | POA: Diagnosis not present

## 2022-04-21 DIAGNOSIS — D485 Neoplasm of uncertain behavior of skin: Secondary | ICD-10-CM | POA: Diagnosis not present

## 2022-04-21 DIAGNOSIS — G4733 Obstructive sleep apnea (adult) (pediatric): Secondary | ICD-10-CM | POA: Diagnosis not present

## 2022-04-21 DIAGNOSIS — L570 Actinic keratosis: Secondary | ICD-10-CM | POA: Diagnosis not present

## 2022-04-21 NOTE — Patient Instructions (Signed)

## 2022-04-29 ENCOUNTER — Telehealth: Payer: Self-pay

## 2022-04-29 NOTE — Telephone Encounter (Signed)
-----   Message from Lavonna Monarch, MD sent at 04/23/2022 11:09 PM EDT ----- Please inform patient that laboratory received the specimen but it did not survive the processing procedure, so no result will be forthcoming.  Dr. Darene Lamer recommends no further biopsy unless there is obvious regrowth.  Schedule a follow-up in 6 months.

## 2022-04-29 NOTE — Telephone Encounter (Signed)
Phone call to patient regarding her pathology results. Patient aware of Dr. Onalee Hua recommendations. 6 month appointment  scheduled.

## 2022-05-06 DIAGNOSIS — G4733 Obstructive sleep apnea (adult) (pediatric): Secondary | ICD-10-CM | POA: Diagnosis not present

## 2022-05-12 ENCOUNTER — Encounter: Payer: Self-pay | Admitting: Dermatology

## 2022-05-12 NOTE — Progress Notes (Signed)
   Follow-Up Visit   Subjective  Valerie Pacheco is a 61 y.o. female who presents for the following: Skin Problem (Here for bx on nose- was going out of town last office visit).  Here for biopsy on lower both of nose plus small crusts on side of nose Location:  Duration:  Quality:  Associated Signs/Symptoms: Modifying Factors:  Severity:  Timing: Context:   Objective  Well appearing patient in no apparent distress; mood and affect are within normal limits. Mid Tip of Nose Subtle 3 mm waxy flat pink crust, rule out superficial carcinoma       Right Nasal Sidewall (2) 2 to 3 mm gritty pink crusts    A focused examination was performed including head and neck.. Relevant physical exam findings are noted in the Assessment and Plan.   Assessment & Plan    Neoplasm of uncertain behavior of skin Mid Tip of Nose  Skin / nail biopsy Type of biopsy: tangential   Informed consent: discussed and consent obtained   Timeout: patient name, date of birth, surgical site, and procedure verified   Procedure prep:  Patient was prepped and draped in usual sterile fashion (Non sterile) Prep type:  Chlorhexidine Anesthesia: the lesion was anesthetized in a standard fashion   Anesthetic:  1% lidocaine w/ epinephrine 1-100,000 local infiltration Instrument used: flexible razor blade   Outcome: patient tolerated procedure well   Post-procedure details: wound care instructions given    Specimen 1 - Surgical pathology Differential Diagnosis: bcc v scc  Check Margins: No  AK (actinic keratosis) (2) Right Nasal Sidewall  Destruction of lesion - Right Nasal Sidewall Complexity: simple   Destruction method: cryotherapy   Informed consent: discussed and consent obtained   Timeout:  patient name, date of birth, surgical site, and procedure verified Lesion destroyed using liquid nitrogen: Yes   Cryotherapy cycles:  3 Outcome: patient tolerated procedure well with no complications         I, Lavonna Monarch, MD, have reviewed all documentation for this visit.  The documentation on 05/12/22 for the exam, diagnosis, procedures, and orders are all accurate and complete.

## 2022-05-20 ENCOUNTER — Other Ambulatory Visit: Payer: Self-pay | Admitting: Gynecology

## 2022-05-20 DIAGNOSIS — Z1231 Encounter for screening mammogram for malignant neoplasm of breast: Secondary | ICD-10-CM

## 2022-06-05 DIAGNOSIS — G4733 Obstructive sleep apnea (adult) (pediatric): Secondary | ICD-10-CM | POA: Diagnosis not present

## 2022-06-16 DIAGNOSIS — Z79899 Other long term (current) drug therapy: Secondary | ICD-10-CM | POA: Diagnosis not present

## 2022-06-16 DIAGNOSIS — M0589 Other rheumatoid arthritis with rheumatoid factor of multiple sites: Secondary | ICD-10-CM | POA: Diagnosis not present

## 2022-07-06 DIAGNOSIS — G4733 Obstructive sleep apnea (adult) (pediatric): Secondary | ICD-10-CM | POA: Diagnosis not present

## 2022-08-11 DIAGNOSIS — G4733 Obstructive sleep apnea (adult) (pediatric): Secondary | ICD-10-CM | POA: Diagnosis not present

## 2022-08-11 DIAGNOSIS — Z79899 Other long term (current) drug therapy: Secondary | ICD-10-CM | POA: Diagnosis not present

## 2022-08-11 DIAGNOSIS — Z111 Encounter for screening for respiratory tuberculosis: Secondary | ICD-10-CM | POA: Diagnosis not present

## 2022-08-11 DIAGNOSIS — R5383 Other fatigue: Secondary | ICD-10-CM | POA: Diagnosis not present

## 2022-08-11 DIAGNOSIS — M069 Rheumatoid arthritis, unspecified: Secondary | ICD-10-CM | POA: Diagnosis not present

## 2022-08-11 DIAGNOSIS — M0589 Other rheumatoid arthritis with rheumatoid factor of multiple sites: Secondary | ICD-10-CM | POA: Diagnosis not present

## 2022-08-11 DIAGNOSIS — E039 Hypothyroidism, unspecified: Secondary | ICD-10-CM | POA: Diagnosis not present

## 2022-09-22 ENCOUNTER — Ambulatory Visit
Admission: RE | Admit: 2022-09-22 | Discharge: 2022-09-22 | Disposition: A | Discharge status: Home / Self Care | Payer: BC Managed Care – PPO | Source: Ambulatory Visit | Attending: Gynecology | Admitting: Gynecology

## 2022-09-22 DIAGNOSIS — Z1231 Encounter for screening mammogram for malignant neoplasm of breast: Secondary | ICD-10-CM

## 2022-09-22 DIAGNOSIS — Z79899 Other long term (current) drug therapy: Secondary | ICD-10-CM | POA: Diagnosis not present

## 2022-09-22 DIAGNOSIS — M0589 Other rheumatoid arthritis with rheumatoid factor of multiple sites: Secondary | ICD-10-CM | POA: Diagnosis not present

## 2022-09-22 DIAGNOSIS — M79641 Pain in right hand: Secondary | ICD-10-CM | POA: Diagnosis not present

## 2022-09-22 DIAGNOSIS — M79642 Pain in left hand: Secondary | ICD-10-CM | POA: Diagnosis not present

## 2022-10-06 ENCOUNTER — Ambulatory Visit: Payer: BC Managed Care – PPO | Admitting: Dermatology

## 2022-10-06 DIAGNOSIS — M0589 Other rheumatoid arthritis with rheumatoid factor of multiple sites: Secondary | ICD-10-CM | POA: Diagnosis not present

## 2022-10-06 DIAGNOSIS — Z79899 Other long term (current) drug therapy: Secondary | ICD-10-CM | POA: Diagnosis not present

## 2022-10-07 DIAGNOSIS — L570 Actinic keratosis: Secondary | ICD-10-CM | POA: Diagnosis not present

## 2022-10-07 DIAGNOSIS — Z85828 Personal history of other malignant neoplasm of skin: Secondary | ICD-10-CM | POA: Diagnosis not present

## 2022-10-07 DIAGNOSIS — L814 Other melanin hyperpigmentation: Secondary | ICD-10-CM | POA: Diagnosis not present

## 2022-10-07 DIAGNOSIS — L821 Other seborrheic keratosis: Secondary | ICD-10-CM | POA: Diagnosis not present

## 2022-10-17 DIAGNOSIS — L570 Actinic keratosis: Secondary | ICD-10-CM | POA: Diagnosis not present

## 2022-10-26 ENCOUNTER — Emergency Department (HOSPITAL_BASED_OUTPATIENT_CLINIC_OR_DEPARTMENT_OTHER): Payer: BC Managed Care – PPO

## 2022-10-26 ENCOUNTER — Emergency Department (HOSPITAL_BASED_OUTPATIENT_CLINIC_OR_DEPARTMENT_OTHER)
Admission: EM | Admit: 2022-10-26 | Discharge: 2022-10-26 | Disposition: A | Discharge status: Home / Self Care | Payer: BC Managed Care – PPO | Attending: Emergency Medicine | Admitting: Emergency Medicine

## 2022-10-26 ENCOUNTER — Other Ambulatory Visit: Payer: Self-pay

## 2022-10-26 ENCOUNTER — Encounter (HOSPITAL_BASED_OUTPATIENT_CLINIC_OR_DEPARTMENT_OTHER): Payer: Self-pay | Admitting: Emergency Medicine

## 2022-10-26 DIAGNOSIS — I129 Hypertensive chronic kidney disease with stage 1 through stage 4 chronic kidney disease, or unspecified chronic kidney disease: Secondary | ICD-10-CM | POA: Insufficient documentation

## 2022-10-26 DIAGNOSIS — Y9361 Activity, american tackle football: Secondary | ICD-10-CM | POA: Diagnosis not present

## 2022-10-26 DIAGNOSIS — E039 Hypothyroidism, unspecified: Secondary | ICD-10-CM | POA: Insufficient documentation

## 2022-10-26 DIAGNOSIS — W03XXXA Other fall on same level due to collision with another person, initial encounter: Secondary | ICD-10-CM | POA: Diagnosis not present

## 2022-10-26 DIAGNOSIS — Z79890 Hormone replacement therapy (postmenopausal): Secondary | ICD-10-CM | POA: Insufficient documentation

## 2022-10-26 DIAGNOSIS — R0781 Pleurodynia: Secondary | ICD-10-CM | POA: Diagnosis not present

## 2022-10-26 DIAGNOSIS — N189 Chronic kidney disease, unspecified: Secondary | ICD-10-CM | POA: Insufficient documentation

## 2022-10-26 DIAGNOSIS — S52124A Nondisplaced fracture of head of right radius, initial encounter for closed fracture: Secondary | ICD-10-CM | POA: Insufficient documentation

## 2022-10-26 DIAGNOSIS — M25521 Pain in right elbow: Secondary | ICD-10-CM | POA: Diagnosis present

## 2022-10-26 MED ORDER — HYDROCODONE-ACETAMINOPHEN 5-325 MG PO TABS
1.0000 | ORAL_TABLET | Freq: Once | ORAL | Status: AC
  Administered 2022-10-26: 1 via ORAL
  Filled 2022-10-26: qty 1

## 2022-10-26 MED ORDER — HYDROCODONE-ACETAMINOPHEN 5-325 MG PO TABS: 1.0000 | ORAL_TABLET | Freq: Four times a day (QID) | ORAL | 0 refills | Status: AC | PRN

## 2022-10-26 NOTE — ED Triage Notes (Signed)
Patient states she fell last night at a game and landed on concrete. Patient c/o right hand pain, elbow pain, and rib pain.

## 2022-10-26 NOTE — ED Notes (Signed)
ED Provider at bedside. 

## 2022-10-26 NOTE — ED Provider Notes (Signed)
[facility] EMERGENCY DEPARTMENT Provider Note   CSN: [id number] Arrival date & time: 10/26/22  1417     History  Chief Complaint  Patient presents with   Valerie Pacheco is a 61 y.o. female with a past medical history significant for CKD, hypertension, hypothyroidism, and RA who presents to the ED due to right elbow pain.  Patient states she was at a football game last night when someone accidentally bumped into her causing her to fall on her right arm.  Patient admits to pain at the elbow that radiates down her right arm.  She admits to numbness/tingling initially which has resolved.  No ecchymosis.  Denies edema.  Pain worse with movement.  Patient is right-hand dominant.  No head injury.  Patient also endorses right-sided rib pain.  Denies shortness of breath.  History obtained from patient and past medical records. No interpreter used during encounter.       Home Medications Prior to Admission medications   Medication Sig Start Date End Date Taking? Authorizing Provider  HYDROcodone-acetaminophen (NORCO/VICODIN) 5-325 MG tablet Take 1 tablet by mouth every 6 (six) hours as needed. 10/26/22  Yes Kemiah Booz, Druscilla Brownie, PA-C  cephALEXin (KEFLEX) 500 MG capsule Take 1 capsule (500 mg total) by mouth 4 (four) times daily. 05/15/20   Davonna Belling, MD  cephALEXin (KEFLEX) 500 MG capsule Take 1 capsule (500 mg total) by mouth 4 (four) times daily. 06/05/20   Fredia Sorrow, MD  cetirizine (ZYRTEC) 10 MG tablet Take 10 mg by mouth at bedtime.     [provider]  Cholecalciferol (VITAMIN D3) 2000 units TABS Take 2,000 Units by mouth at bedtime.    [provider]  estradiol (VIVELLE-DOT) 0.075 MG/24HR Place 0.5 patches onto the skin 2 (two) times a week. Wednesday & Sunday 04/05/17   [provider]  Ferrous Sulfate Dried 200 (65 Fe) MG TABS Take 200 mg by mouth at bedtime.    [provider]  folic acid (FOLVITE) 1 MG tablet Take 2  mg by mouth at bedtime. 01/28/17   [provider]  Golimumab ([place] ARIA IV) Inject 1 Dose into the vein every 8 (eight) weeks.    [provider]  HYDROcodone-acetaminophen (NORCO/VICODIN) 5-325 MG tablet Take 1-2 tablets by mouth every 4 (four) hours as needed. 05/14/20   Davonna Belling, MD  ibuprofen (ADVIL,MOTRIN) 200 MG tablet Take 400 mg by mouth every 8 (eight) hours as needed (for headaches.).    [provider]  levothyroxine (SYNTHROID, LEVOTHROID) 100 MCG tablet Take 100 mcg by mouth daily before breakfast.  11/16/16   [provider]  Magnesium 250 MG TABS Take 500 mg by mouth at bedtime.     [provider]  methotrexate (RHEUMATREX) 2.5 MG tablet Take 20 mg by mouth every Tuesday at 6 PM. Taking 8 tabs every Tuesday 02/06/17   [provider]  naproxen sodium (ANAPROX) 220 MG tablet Take 220 mg by mouth every 8 (eight) hours as needed (for headaches.).    [provider]  omeprazole (PRILOSEC) 20 MG capsule Take 20 mg by mouth at bedtime.     [provider]  ondansetron (ZOFRAN-ODT) 4 MG disintegrating tablet Take 1 tablet (4 mg total) by mouth every 8 (eight) hours as needed for nausea or vomiting. 05/14/20   Davonna Belling, MD  progesterone (PROMETRIUM) 200 MG capsule Take 200 mg by mouth See admin instructions. TAKE 1 CAPSULE (200 MG) BY MOUTH DAILY FOR  [place] 02/06/17   [provider]  verapamil (VERELAN PM) 120 MG 24 hr capsule Take 180 mg by mouth at bedtime. 01/20/17   [provider]      Allergies    Patient has no known allergies.    Review of Systems   Review of Systems  Respiratory:  Negative for shortness of breath.   Cardiovascular:  Positive for chest pain (right sided rib pain).  Musculoskeletal:  Positive for arthralgias.  All other systems reviewed and are negative.   Physical Exam Updated Vital Signs BP (!) 151/82   Pulse 72   Temp 97.8 F (36.6  C) (Oral)   Resp 16   Ht '5\' 5"'$  (1.651 m)   Wt 83.9 kg   LMP 07/23/2011   SpO2 93%   BMI 30.79 kg/m  Physical Exam Vitals and nursing note reviewed.  Constitutional:      General: She is not in acute distress.    Appearance: She is not ill-appearing.  HENT:     Head: Normocephalic.  Eyes:     Pupils: Pupils are equal, round, and reactive to light.  Cardiovascular:     Rate and Rhythm: Normal rate and regular rhythm.     Pulses: Normal pulses.     Heart sounds: Normal heart sounds. No murmur heard.    No friction rub. No gallop.  Pulmonary:     Effort: Pulmonary effort is normal.     Breath sounds: Normal breath sounds.     Comments: Respirations equal and unlabored, patient able to speak in full sentences, lungs clear to auscultation bilaterally Chest:     Comments: Tenderness throughout right side of ribs without crepitus or deformity. Abdominal:     General: Abdomen is flat. There is no distension.     Palpations: Abdomen is soft.     Tenderness: There is no abdominal tenderness. There is no guarding or rebound.  Musculoskeletal:        General: Normal range of motion.     Cervical back: Neck supple.     Comments: Tenderness throughout right elbow with decreased range of motion due to pain.  No tenderness to right shoulder or wrist.  Soft compartments.  Radial pulse intact.  Full range of motion of right shoulder and right wrist.   Skin:    General: Skin is warm and dry.  Neurological:     General: No focal deficit present.     Mental Status: She is alert.  Psychiatric:        Mood and Affect: Mood normal.        Behavior: Behavior normal.     ED Results / Procedures / Treatments   Labs (all labs ordered are listed, but only abnormal results are displayed) Labs Reviewed - No data to display  EKG None  Radiology DG Elbow Complete Right  Result Date: 10/26/2022 CLINICAL DATA:  Fall EXAM: RIGHT ELBOW - COMPLETE 3 VIEW COMPARISON:  None Available. FINDINGS:  Nondisplaced radial head fracture with intra-articular extension. Elbow joint effusion no evidence of arthropathy or other focal bone abnormality. Soft tissues are unremarkable. IMPRESSION: Nondisplaced radial head fracture with intra-articular extension. Electronically Signed   By: Yetta Glassman M.D.   On: 10/26/2022 15:14   DG Ribs Unilateral W/Chest Right  Result Date: 10/26/2022 CLINICAL DATA:  Right rib pain after fall last night. EXAM: RIGHT RIBS AND CHEST - 3+ VIEW COMPARISON:  October 14, 2018. FINDINGS: No fracture or other bone lesions  are seen involving the ribs. There is no evidence of pneumothorax or pleural effusion. Both lungs are clear. Heart size and mediastinal contours are within normal limits. IMPRESSION: Negative. Electronically Signed   By: Marijo Conception M.D.   On: 10/26/2022 15:13   DG Hand Complete Right  Result Date: 10/26/2022 CLINICAL DATA:  Fall EXAM: RIGHT HAND - COMPLETE 3 VIEW COMPARISON:  None Available. FINDINGS: There is no evidence of fracture or dislocation. There is no evidence of arthropathy or other focal bone abnormality. Soft tissues are unremarkable. IMPRESSION: Negative. Electronically Signed   By: Yetta Glassman M.D.   On: 10/26/2022 15:11    Procedures .Splint Application  Date/Time: 10/26/2022 3:43 PM  Performed by: Suzy Bouchard, PA-C Authorized by: Suzy Bouchard, PA-C   Post-procedure details:    Distal neurologic exam:  Normal   Distal perfusion: distal pulses strong     Procedure completion:  Tolerated well, no immediate complications   Post-procedure imaging: not applicable       Medications Ordered in ED Medications  HYDROcodone-acetaminophen (NORCO/VICODIN) 5-325 MG per tablet 1 tablet (1 tablet Oral Given 10/26/22 1536)    ED Course/ Medical Decision Making/ A&P                           Medical Decision Making Amount and/or Complexity of Data Reviewed Independent Historian: spouse Radiology: ordered and  independent interpretation performed. Decision-making details documented in ED Course.  Risk Prescription drug management.   61 year old female presents to the ED due to right elbow pain after falling on her arm last night.  Patient is right-hand dominant.  No head injury.  Patient also endorses right-sided rib pain.  No shortness of breath.  Upon arrival, stable vitals.  Patient in no acute distress.  Physical exam significant for tenderness throughout right elbow.  Right upper extremity neurovascularly intact with soft compartments.  Low suspicion for compartment syndrome.  X-ray ordered at triage which I personally reviewed which demonstrates a nondisplaced radial head fracture with Intra-articular extension.  Agree with radiology interpretation.  Chest/rib and hand x-ray negative for bony fractures. Low suspicion for PTX given unremarkable x-ray. Patient placed in splint and given pain medication here in the ED.  Orthopedics number given to patient at discharge and advised to call tomorrow to schedule an appointment for further evaluation. Strict ED precautions discussed with patient. Patient states understanding and agrees to plan. Patient discharged home in no acute distress and stable vitals       Final Clinical Impression(s) / ED Diagnoses Final diagnoses:  Closed nondisplaced fracture of head of right radius, initial encounter    Rx / DC Orders ED Discharge Orders          Ordered    HYDROcodone-acetaminophen (NORCO/VICODIN) 5-325 MG tablet  Every 6 hours PRN        10/26/22 1542              Suzy Bouchard, PA-C 10/26/22 1618    Drenda Freeze, MD 10/26/22 2315

## 2022-10-26 NOTE — Discharge Instructions (Signed)
It was a pleasure taking care of you today.  As discussed, you have a broken bone at your radial head.  I am sending you home with a strong pain medication.  Take for severe pain.  Medication can cause drowsiness so do not drive or operate machinery while on the medication.  I have included the number of the orthopedic surgeon.  Please call to schedule an appointment for further evaluation.  Keep splint on until evaluated by the orthopedic surgeon.  Return to the ER for new or worsening symptoms.

## 2023-08-27 ENCOUNTER — Other Ambulatory Visit: Payer: Self-pay | Admitting: Gynecology

## 2023-08-27 DIAGNOSIS — Z1231 Encounter for screening mammogram for malignant neoplasm of breast: Secondary | ICD-10-CM

## 2023-10-27 ENCOUNTER — Ambulatory Visit
Admission: RE | Admit: 2023-10-27 | Discharge: 2023-10-27 | Disposition: A | Discharge status: Home / Self Care | Payer: 59 | Source: Ambulatory Visit | Attending: Gynecology | Admitting: Gynecology

## 2023-10-27 DIAGNOSIS — Z1231 Encounter for screening mammogram for malignant neoplasm of breast: Secondary | ICD-10-CM

## 2024-08-17 ENCOUNTER — Other Ambulatory Visit: Payer: Self-pay | Admitting: Gynecology

## 2024-08-17 DIAGNOSIS — Z1231 Encounter for screening mammogram for malignant neoplasm of breast: Secondary | ICD-10-CM

## 2024-11-21 ENCOUNTER — Inpatient Hospital Stay: Admission: RE | Admit: 2024-11-21 | Discharge: 2024-11-21 | Attending: Gynecology | Admitting: Gynecology

## 2024-11-21 DIAGNOSIS — Z1231 Encounter for screening mammogram for malignant neoplasm of breast: Secondary | ICD-10-CM
